# Patient Record
Sex: Female | Born: 1970 | Race: White | Hispanic: Yes | Marital: Single | Smoking: Never smoker
Health system: Southern US, Community
[De-identification: ages and names within clinical notes are randomized; demographics above are authoritative.]

## PROBLEM LIST (undated history)

## (undated) DIAGNOSIS — F419 Anxiety disorder, unspecified: Secondary | ICD-10-CM

## (undated) DIAGNOSIS — M1990 Unspecified osteoarthritis, unspecified site: Secondary | ICD-10-CM

## (undated) DIAGNOSIS — F32A Depression, unspecified: Secondary | ICD-10-CM

## (undated) DIAGNOSIS — F329 Major depressive disorder, single episode, unspecified: Secondary | ICD-10-CM

## (undated) DIAGNOSIS — I10 Essential (primary) hypertension: Secondary | ICD-10-CM

## (undated) HISTORY — DX: Major depressive disorder, single episode, unspecified: F32.9

## (undated) HISTORY — DX: Unspecified osteoarthritis, unspecified site: M19.90

## (undated) HISTORY — DX: Depression, unspecified: F32.A

## (undated) HISTORY — DX: Essential (primary) hypertension: I10

## (undated) HISTORY — DX: Anxiety disorder, unspecified: F41.9

---

## 1998-06-06 ENCOUNTER — Inpatient Hospital Stay (HOSPITAL_COMMUNITY): Admission: AD | Admit: 1998-06-06 | Discharge: 1998-06-06 | Payer: Self-pay | Admitting: *Deleted

## 1998-06-06 ENCOUNTER — Encounter: Payer: Self-pay | Admitting: *Deleted

## 1998-06-24 ENCOUNTER — Other Ambulatory Visit: Admission: RE | Admit: 1998-06-24 | Discharge: 1998-06-24 | Payer: Self-pay | Admitting: Obstetrics and Gynecology

## 1998-12-28 ENCOUNTER — Inpatient Hospital Stay (HOSPITAL_COMMUNITY): Admission: AD | Admit: 1998-12-28 | Discharge: 1999-01-01 | Payer: Self-pay | Admitting: Obstetrics

## 1999-06-04 ENCOUNTER — Encounter: Admission: RE | Admit: 1999-06-04 | Discharge: 1999-06-04 | Payer: Self-pay | Admitting: Internal Medicine

## 1999-08-11 ENCOUNTER — Encounter: Admission: RE | Admit: 1999-08-11 | Discharge: 1999-08-11 | Payer: Self-pay | Admitting: Internal Medicine

## 1999-12-24 ENCOUNTER — Encounter: Admission: RE | Admit: 1999-12-24 | Discharge: 1999-12-24 | Payer: Self-pay | Admitting: Internal Medicine

## 2000-02-07 ENCOUNTER — Emergency Department (HOSPITAL_COMMUNITY): Admission: EM | Admit: 2000-02-07 | Discharge: 2000-02-08 | Payer: Self-pay

## 2000-02-08 ENCOUNTER — Encounter: Payer: Self-pay | Admitting: Emergency Medicine

## 2000-07-14 ENCOUNTER — Encounter: Admission: RE | Admit: 2000-07-14 | Discharge: 2000-07-14 | Payer: Self-pay | Admitting: Internal Medicine

## 2000-07-19 ENCOUNTER — Encounter: Admission: RE | Admit: 2000-07-19 | Discharge: 2000-07-19 | Payer: Self-pay | Admitting: Internal Medicine

## 2000-08-02 ENCOUNTER — Encounter: Admission: RE | Admit: 2000-08-02 | Discharge: 2000-08-02 | Payer: Self-pay | Admitting: Hematology and Oncology

## 2001-08-13 ENCOUNTER — Encounter: Admission: RE | Admit: 2001-08-13 | Discharge: 2001-08-13 | Payer: Self-pay | Admitting: Family Medicine

## 2001-08-22 ENCOUNTER — Ambulatory Visit (HOSPITAL_COMMUNITY): Admission: RE | Admit: 2001-08-22 | Discharge: 2001-08-22 | Payer: Self-pay | Admitting: Family Medicine

## 2001-09-18 ENCOUNTER — Encounter: Admission: RE | Admit: 2001-09-18 | Discharge: 2001-09-18 | Payer: Self-pay | Admitting: Family Medicine

## 2001-09-25 ENCOUNTER — Encounter: Admission: RE | Admit: 2001-09-25 | Discharge: 2001-09-25 | Payer: Self-pay | Admitting: Family Medicine

## 2001-11-05 ENCOUNTER — Encounter: Admission: RE | Admit: 2001-11-05 | Discharge: 2001-11-05 | Payer: Self-pay | Admitting: Family Medicine

## 2001-11-21 ENCOUNTER — Encounter: Admission: RE | Admit: 2001-11-21 | Discharge: 2001-11-21 | Payer: Self-pay | Admitting: Family Medicine

## 2001-12-05 ENCOUNTER — Encounter: Admission: RE | Admit: 2001-12-05 | Discharge: 2001-12-05 | Payer: Self-pay | Admitting: Family Medicine

## 2001-12-12 ENCOUNTER — Encounter: Admission: RE | Admit: 2001-12-12 | Discharge: 2001-12-12 | Payer: Self-pay | Admitting: Family Medicine

## 2001-12-21 ENCOUNTER — Encounter: Admission: RE | Admit: 2001-12-21 | Discharge: 2001-12-21 | Payer: Self-pay | Admitting: Family Medicine

## 2001-12-27 ENCOUNTER — Inpatient Hospital Stay (HOSPITAL_COMMUNITY): Admission: AD | Admit: 2001-12-27 | Discharge: 2001-12-28 | Payer: Self-pay | Admitting: Obstetrics and Gynecology

## 2002-03-12 ENCOUNTER — Encounter: Admission: RE | Admit: 2002-03-12 | Discharge: 2002-03-12 | Payer: Self-pay | Admitting: Family Medicine

## 2006-01-01 ENCOUNTER — Encounter (INDEPENDENT_AMBULATORY_CARE_PROVIDER_SITE_OTHER): Payer: Self-pay | Admitting: *Deleted

## 2006-04-03 ENCOUNTER — Ambulatory Visit: Payer: Self-pay | Admitting: Family Medicine

## 2006-04-03 ENCOUNTER — Ambulatory Visit (HOSPITAL_COMMUNITY): Admission: RE | Admit: 2006-04-03 | Discharge: 2006-04-03 | Payer: Self-pay | Admitting: Family Medicine

## 2006-04-28 ENCOUNTER — Ambulatory Visit: Payer: Self-pay | Admitting: Family Medicine

## 2006-05-05 ENCOUNTER — Ambulatory Visit: Payer: Self-pay | Admitting: Family Medicine

## 2006-06-09 ENCOUNTER — Ambulatory Visit: Payer: Self-pay | Admitting: Family Medicine

## 2006-08-31 DIAGNOSIS — G43909 Migraine, unspecified, not intractable, without status migrainosus: Secondary | ICD-10-CM

## 2006-08-31 DIAGNOSIS — E669 Obesity, unspecified: Secondary | ICD-10-CM | POA: Insufficient documentation

## 2006-08-31 DIAGNOSIS — I10 Essential (primary) hypertension: Secondary | ICD-10-CM | POA: Insufficient documentation

## 2006-09-01 ENCOUNTER — Encounter (INDEPENDENT_AMBULATORY_CARE_PROVIDER_SITE_OTHER): Payer: Self-pay | Admitting: *Deleted

## 2007-06-23 ENCOUNTER — Inpatient Hospital Stay (HOSPITAL_COMMUNITY): Admission: AD | Admit: 2007-06-23 | Discharge: 2007-06-23 | Payer: Self-pay | Admitting: Gynecology

## 2007-08-30 ENCOUNTER — Ambulatory Visit (HOSPITAL_COMMUNITY): Admission: RE | Admit: 2007-08-30 | Discharge: 2007-08-30 | Payer: Self-pay | Admitting: Obstetrics and Gynecology

## 2007-09-13 ENCOUNTER — Ambulatory Visit (HOSPITAL_COMMUNITY): Admission: RE | Admit: 2007-09-13 | Discharge: 2007-09-13 | Payer: Self-pay | Admitting: Obstetrics and Gynecology

## 2008-01-20 ENCOUNTER — Ambulatory Visit: Payer: Self-pay | Admitting: Obstetrics and Gynecology

## 2008-01-20 ENCOUNTER — Inpatient Hospital Stay (HOSPITAL_COMMUNITY): Admission: AD | Admit: 2008-01-20 | Discharge: 2008-01-20 | Payer: Self-pay | Admitting: Obstetrics and Gynecology

## 2008-01-25 ENCOUNTER — Inpatient Hospital Stay (HOSPITAL_COMMUNITY): Admission: AD | Admit: 2008-01-25 | Discharge: 2008-01-26 | Payer: Self-pay | Admitting: Gynecology

## 2008-01-25 ENCOUNTER — Ambulatory Visit: Payer: Self-pay | Admitting: Family Medicine

## 2010-11-22 ENCOUNTER — Ambulatory Visit (INDEPENDENT_AMBULATORY_CARE_PROVIDER_SITE_OTHER): Payer: Self-pay | Admitting: Family Medicine

## 2010-11-22 ENCOUNTER — Encounter: Payer: Self-pay | Admitting: Family Medicine

## 2010-11-22 VITALS — BP 144/86 | HR 80 | Temp 97.7°F | Ht 60.0 in | Wt 172.5 lb

## 2010-11-22 DIAGNOSIS — R5383 Other fatigue: Secondary | ICD-10-CM

## 2010-11-22 DIAGNOSIS — I10 Essential (primary) hypertension: Secondary | ICD-10-CM

## 2010-11-22 DIAGNOSIS — E669 Obesity, unspecified: Secondary | ICD-10-CM

## 2010-11-22 LAB — BASIC METABOLIC PANEL
BUN: 11 mg/dL (ref 6–23)
Chloride: 104 mEq/L (ref 96–112)
Creat: 0.47 mg/dL (ref 0.40–1.20)
Glucose, Bld: 95 mg/dL (ref 70–99)

## 2010-11-22 LAB — CBC
HCT: 42.6 % (ref 36.0–46.0)
Hemoglobin: 14.3 g/dL (ref 12.0–15.0)
MCV: 91.2 fL (ref 78.0–100.0)
RDW: 13.1 % (ref 11.5–15.5)
WBC: 5.6 10*3/uL (ref 4.0–10.5)

## 2010-11-22 NOTE — Patient Instructions (Addendum)
Nice to meet you. Normal blood pressure is <140/90. Please make an appointment in next month for blood pressure. I will call you if your lab tests are abnormal.     Hipertensin (presin arterial elevada) (Hypertension, High Blood Pressure) Cuando el corazn late [employer] a travs de las arterias. La fuerza que se origina es la presin arterial. Si la presin es demasiado elevada, se denomina hipertensin. El peligro radica en que puede sufrirla y no saberlo. Hipertensin puede significar que su corazn debe trabajar ms intensamente para bombear sangre. Las arterias pueden Dietitian o rgidas. El [place] extra [place] el riesgo de enfermedades cardacas, ictus y [employer].  La presin arterial est formada por dos nmeros: el nmero mayor sobre el nmero menor, por ejemplo110/70. Se seala "110/72". Los valores ideales son por debajo de 120 para el nmero ms alto (sistlica) y por debajo de 80 para el ms bajo (diastlica). Anote su presin sangunea hoy. Debe prestar mucha atencin a su presin arterial si sufre alguna otra enfermedad como:  Insuficiencia cardaca  Ataques cardiacos previos   Diabetes   Enfermedad renal crnica   Ictus previo   Mltiples factores de riesgo para enfermedades cardacas   Para diagnosticar si usted sufre hipertensin arterial, debe medirse la presin mientras encuentra sentado con el brazo elevado a la altura del nivel del corazn. Debe medirse al menos 2 veces. Una nica lectura de presin arterial elevada (especialmente en el servicio de emergencias) no significa que necesita tratamiento. Hay enfermedades en las que la presin arterial es diferente en ambos brazos. Es importante que consulte rpidamente con su mdico para un control. La [employer] de las personas sufren hipertensin esencial, lo que significa que no tiene una causa especfica. Este tipo de hipertensin puede bajarse modificando algunos factores en el estilo de vida  como:  Librarian, academic.  El consumo de cigarrillos.   La falta de actividad fsica.   Peso excesivo  Consumo de drogas y alcohol.   Consumiendo menos sal   La mayora de las personas no tienen sntomas hasta que la hipertensin ocasiona un dao en el organismo. El tratamiento efectivo puede evitar, Designer, industrial/product o reducir ese dao. TRATAMIENTO: El tratamiento para la hipertensin, cuando se ha identificado una causa, est dirigido a la misma Hay un gran nmero en medicamentos para tratarla. Se agrupan en diferentes categoras y Media planner los medicamentos indicados para usted. Muchos medicamentos disponibles tienen efectos secundarios. Debe comentar los efectos secundarios con su mdico. Si la presin arterial permanece elevada despus de modificar su estilo de vida o comenzar a tomar medicamentos:  Los medicamentos deben ser reemplazados   Puede ser necesario evaluar otros problemas.   Debe estar seguro que comprende las indicaciones, que sabe cmo y cundo [employer].   Asegrese de Education officer, environmental un control con su mdico dentro del tiempo indicado (generalmente dentro de las [employer]) para volver a Systems analyst presin arterial y Dentist los medicamentos prescritos.   Si est tomando ms de un medicamento para la presin arterial, asegrese que sabe cmo y en qu momentos debe tomarlos. Tomar los medicamentos al mismo tiempo puede dar como resultado un gran descenso en la presin arterial.  SOLICITE ATENCIN MDICA DE INMEDIATO SI PRESENTA:  Dolor de cabeza intenso, visin borrosa o cambios en la visin, o confusin.   Debilidad o adormecimientos inusuales o sensacin de desmayo.   Dolor de pecho o abdominal intenso, vmitos o problemas para respirar.  ASEGRESE QUE:   Comprende estas instrucciones.  Controlar su enfermedad.   Solicitar ayuda inmediatamente si no mejora o si empeora.  Document Released: 06/20/2005 Document Re-Released: 12/08/2009 [facility] Patient  Information 2011 [place], [place].

## 2010-11-22 NOTE — Assessment & Plan Note (Signed)
Will screen for diabetes with A1c testing and TSH today. Discussed starting exercise on a daily basis and patient agrees to attempt this.

## 2010-11-22 NOTE — Assessment & Plan Note (Addendum)
Borderline pressure today. Discussed lifestyle modifications of diet and exercise. Encouraged patient to check BP with goal of <140/90. Will follow up in one month and consider medication if not responsive to conservative management. Screening labs today.

## 2010-11-22 NOTE — Progress Notes (Signed)
  Subjective:    Patient ID: Lisa [name], female    DOB: 11/18/1970, 40 y.o.   MRN: [id number]  HPI [facility] patient presents from [facility].  1. Fatigue. Present since March (3 months now). Has no energy despite 7 hours of sleep daily. Is working currently, but struggle with motivation. Unsure of reason. Has very light menstrual periods on a monthly basis since IUD was placed. No blood in stool. Has struggled with being overweight for some time. Endorses some depressed mood that started after the fatigue.  2. BP. Had been receiving routine GYN care at [facility] health department and has been told to see a doctor for high blood pressure. Has several family members with hypertension.   3. Health maintenance. Gets yearly pap testing at [facility]. Patient denies ever having abnormal. Mirena IUD since 2009.    Review of Systems Denies abnormal bleeding or bruising. Endorses skin changes including dark spots on face.     Objective:   Physical Exam  Vitals reviewed. Constitutional: She is oriented to person, place, and time. She appears well-developed and well-nourished. No distress.  HENT:  Head: Normocephalic and atraumatic.       Mild macular melasma in malar area, R>L.   Neck: Normal range of motion. Neck supple.  Cardiovascular: Normal rate, regular rhythm and normal heart sounds.  Exam reveals no gallop and no friction rub.   No murmur heard. Pulmonary/Chest: Effort normal and breath sounds normal. No respiratory distress. She has no wheezes. She has no rales.  Musculoskeletal: She exhibits no edema and no tenderness.  Neurological: She is alert and oriented to person, place, and time. No cranial nerve deficit. Coordination normal.  Skin: Skin is warm.          Assessment & Plan:

## 2010-11-22 NOTE — Assessment & Plan Note (Signed)
Vague complaint and uncertain etiology. Possible organic causes thyroid dysregulation, anemia, diabetes. Less likely depression. Will obtain screening labs and follow up in one month.

## 2010-11-23 ENCOUNTER — Encounter: Payer: Self-pay | Admitting: Family Medicine

## 2011-01-31 ENCOUNTER — Encounter: Payer: Self-pay | Admitting: Family Medicine

## 2011-01-31 ENCOUNTER — Ambulatory Visit (INDEPENDENT_AMBULATORY_CARE_PROVIDER_SITE_OTHER): Payer: Self-pay | Admitting: Family Medicine

## 2011-01-31 VITALS — BP 150/90 | HR 70 | Temp 98.2°F | Wt 173.6 lb

## 2011-01-31 DIAGNOSIS — R5383 Other fatigue: Secondary | ICD-10-CM

## 2011-01-31 DIAGNOSIS — I10 Essential (primary) hypertension: Secondary | ICD-10-CM

## 2011-01-31 DIAGNOSIS — B009 Herpesviral infection, unspecified: Secondary | ICD-10-CM

## 2011-01-31 DIAGNOSIS — F329 Major depressive disorder, single episode, unspecified: Secondary | ICD-10-CM

## 2011-01-31 DIAGNOSIS — R5381 Other malaise: Secondary | ICD-10-CM

## 2011-01-31 MED ORDER — VALACYCLOVIR HCL 500 MG PO TABS: 500.0000 mg | ORAL_TABLET | Freq: Every day | ORAL | Status: DC

## 2011-01-31 MED ORDER — HYDROCHLOROTHIAZIDE 25 MG PO TABS: 25.0000 mg | ORAL_TABLET | Freq: Every day | ORAL | Status: DC

## 2011-01-31 NOTE — Patient Instructions (Signed)
Nice to see you again. I will refill your medicines. Make appointment in one month for physical exam. Try to exercise daily to help your energy level.  Hipertensin (presin arterial elevada) (Hypertension, High Blood Pressure) Cuando el corazn late [employer] a travs de las arterias. La fuerza que se origina es la presin arterial. Si la presin es demasiado elevada, se denomina hipertensin. El peligro radica en que puede sufrirla y no saberlo. Hipertensin puede significar que su corazn debe trabajar ms intensamente para bombear sangre. Las arterias pueden Dietitian o rgidas. El [place] extra [place] el riesgo de enfermedades cardacas, ictus y [employer].  La presin arterial est formada por dos nmeros: el nmero mayor sobre el nmero menor, por ejemplo110/70. Se seala "110/72". Los valores ideales son por debajo de 120 para el nmero ms alto (sistlica) y por debajo de 80 para el ms bajo (diastlica). Anote su presin sangunea hoy. Debe prestar mucha atencin a su presin arterial si sufre alguna otra enfermedad como:  Insuficiencia cardaca  Ataques cardiacos previos   Diabetes   Enfermedad renal crnica   Ictus previo   Mltiples factores de riesgo para enfermedades cardacas   Para diagnosticar si usted sufre hipertensin arterial, debe medirse la presin mientras encuentra sentado con el brazo elevado a la altura del nivel del corazn. Debe medirse al menos 2 veces. Una nica lectura de presin arterial elevada (especialmente en el servicio de emergencias) no significa que necesita tratamiento. Hay enfermedades en las que la presin arterial es diferente en ambos brazos. Es importante que consulte rpidamente con su mdico para un control. La [employer] de las personas sufren hipertensin esencial, lo que significa que no tiene una causa especfica. Este tipo de hipertensin puede bajarse modificando algunos factores en el estilo de vida como:  Librarian, academic.  El  consumo de cigarrillos.   La falta de actividad fsica.   Peso excesivo  Consumo de drogas y alcohol.   Consumiendo menos sal   La mayora de las personas no tienen sntomas hasta que la hipertensin ocasiona un dao en el organismo. El tratamiento efectivo puede evitar, Designer, industrial/product o reducir ese dao. TRATAMIENTO: El tratamiento para la hipertensin, cuando se ha identificado una causa, est dirigido a la misma Hay un gran nmero en medicamentos para tratarla. Se agrupan en diferentes categoras y Media planner los medicamentos indicados para usted. Muchos medicamentos disponibles tienen efectos secundarios. Debe comentar los efectos secundarios con su mdico. Si la presin arterial permanece elevada despus de modificar su estilo de vida o comenzar a tomar medicamentos:  Los medicamentos deben ser reemplazados   Puede ser necesario evaluar otros problemas.   Debe estar seguro que comprende las indicaciones, que sabe cmo y cundo [employer].   Asegrese de Education officer, environmental un control con su mdico dentro del tiempo indicado (generalmente dentro de las [employer]) para volver a Systems analyst presin arterial y Dentist los medicamentos prescritos.   Si est tomando ms de un medicamento para la presin arterial, asegrese que sabe cmo y en qu momentos debe tomarlos. Tomar los medicamentos al mismo tiempo puede dar como resultado un gran descenso en la presin arterial.  SOLICITE ATENCIN MDICA DE INMEDIATO SI PRESENTA:  Dolor de cabeza intenso, visin borrosa o cambios en la visin, o confusin.   Debilidad o adormecimientos inusuales o sensacin de desmayo.   Dolor de pecho o abdominal intenso, vmitos o problemas para respirar.  ASEGRESE QUE:   Comprende estas instrucciones.   Controlar su enfermedad.  Solicitar ayuda inmediatamente si no mejora o si empeora.  Document Released: 06/20/2005 Document Re-Released: 12/08/2009 [facility] Patient Information 2011  [place], [place].

## 2011-02-01 ENCOUNTER — Encounter: Payer: Self-pay | Admitting: Family Medicine

## 2011-02-01 DIAGNOSIS — B009 Herpesviral infection, unspecified: Secondary | ICD-10-CM | POA: Insufficient documentation

## 2011-02-01 DIAGNOSIS — F329 Major depressive disorder, single episode, unspecified: Secondary | ICD-10-CM | POA: Insufficient documentation

## 2011-02-01 NOTE — Assessment & Plan Note (Signed)
Consistently above goal despite exercise and diet. Will start HCTZ 12.5 and titrate as needed. F/u in one month for CPE and will check BP at home. Labs checked recently show normal renal function. May consider low dose ACEI or BB if needed for goal <140/90.

## 2011-02-01 NOTE — Assessment & Plan Note (Signed)
TSH, CBC, BMET all normal 2 months ago. I suspect this may be related to depression symptoms. Encouraged regular exercise and discussed future utility of SSRI or therapy if patient desires. Will defer today.

## 2011-02-01 NOTE — Progress Notes (Signed)
  Subjective:    Patient ID: Lisa [name], female    DOB: 1970/07/20, 40 y.o.   MRN: [id number]  HPI  1. BP. Has attempted diet and exercise but BP remains in 140s consistently. Does not take any medications. Denies HA, CP, dyspnea, edema.  2. 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 No active lesions currently. Has been taking suppressive valtrex prescribed by health dept for months. Since she is transitioning care to this clinic, requests for [place] to assume this prescription.   3. Fatigue. For about one year feels tired. Not sure why. Sleep is adequate and uninterrupted normally. 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 Does admit to feeling sad regularly. Does not find pleasure in activities. No appetite changes or sleep disturbance.   Review of Systems Denies hematochezia, heavy menstrual periods, weight change, dizzyness.     Objective:   Physical Exam  Vitals reviewed. Constitutional: She is oriented to person, place, and time. She appears well-developed and well-nourished. No distress.  HENT:  Head: Normocephalic and atraumatic.  Eyes: EOM are normal. Pupils are equal, round, and reactive to light.  Cardiovascular: Normal rate, regular rhythm and normal heart sounds.   No murmur heard. Pulmonary/Chest: Effort normal and breath sounds normal. No respiratory distress. She has no wheezes. She has no rales.  Musculoskeletal: She exhibits no edema.  Neurological: She is alert and oriented to person, place, and time.  Psychiatric: Her behavior is normal.       Slight flat affect but does smile infrequently.           Assessment & Plan:

## 2011-02-01 NOTE — Assessment & Plan Note (Signed)
Encouraged patient to use family members for support and take time for herself on a daily basis. Language barrier seemed to create some difficulty. Will check PHQ-9 at next visit and may benefit from SSRI.

## 2011-02-01 NOTE — Assessment & Plan Note (Signed)
Has had 2 outbreaks in 6 months, none currently.  Will continue suppressive therapy 500mg  daily and BID x 3 days for outbreaks. Refilled this medication today. May require higher dose if frequency increases.

## 2011-03-03 ENCOUNTER — Encounter: Payer: Self-pay | Admitting: Family Medicine

## 2011-03-03 ENCOUNTER — Ambulatory Visit (INDEPENDENT_AMBULATORY_CARE_PROVIDER_SITE_OTHER): Payer: Self-pay | Admitting: Family Medicine

## 2011-03-03 ENCOUNTER — Other Ambulatory Visit (HOSPITAL_COMMUNITY)
Admission: RE | Admit: 2011-03-03 | Discharge: 2011-03-03 | Disposition: A | Discharge status: Home / Self Care | Payer: Self-pay | Source: Ambulatory Visit | Attending: Family Medicine | Admitting: Family Medicine

## 2011-03-03 VITALS — BP 131/80 | HR 67 | Temp 98.5°F | Ht 59.75 in | Wt 173.0 lb

## 2011-03-03 DIAGNOSIS — N760 Acute vaginitis: Secondary | ICD-10-CM

## 2011-03-03 DIAGNOSIS — R5381 Other malaise: Secondary | ICD-10-CM

## 2011-03-03 DIAGNOSIS — Z23 Encounter for immunization: Secondary | ICD-10-CM

## 2011-03-03 DIAGNOSIS — Z01419 Encounter for gynecological examination (general) (routine) without abnormal findings: Secondary | ICD-10-CM | POA: Insufficient documentation

## 2011-03-03 DIAGNOSIS — R5383 Other fatigue: Secondary | ICD-10-CM

## 2011-03-03 DIAGNOSIS — N898 Other specified noninflammatory disorders of vagina: Secondary | ICD-10-CM | POA: Insufficient documentation

## 2011-03-03 DIAGNOSIS — I10 Essential (primary) hypertension: Secondary | ICD-10-CM

## 2011-03-03 DIAGNOSIS — F329 Major depressive disorder, single episode, unspecified: Secondary | ICD-10-CM

## 2011-03-03 DIAGNOSIS — B009 Herpesviral infection, unspecified: Secondary | ICD-10-CM

## 2011-03-03 DIAGNOSIS — Z124 Encounter for screening for malignant neoplasm of cervix: Secondary | ICD-10-CM

## 2011-03-03 LAB — POCT WET PREP (WET MOUNT)
Clue Cells Wet Prep HPF POC: NEGATIVE
Trichomonas Wet Prep HPF POC: NEGATIVE

## 2011-03-03 NOTE — Progress Notes (Signed)
  Subjective:    Patient ID: Lisa [name], female    DOB: 12-16-1970, 40 y.o.   MRN: [id number]  HPI CPP.  1. HTN. Has been taking HCTZ daily. Does not check bp at home. Denies any medication side effects or presyncope.   2. Fatigue. Patient feels improvement in her generalized malaise and fatigue since initiation of HCTZ. Denies depression, sadness, guilt.  3. HM. Has mirena. Intermittent spotting other regular menstrual cycle. Does not perform breast exams, not felt any lumps.   Review of Systems Endorses mild symmetric LE swelling at end of the day and intemittent spotting, vaginal discharge. No weight change, vision, change, dysuria, polyuria, CP, dypsnea.     Objective:   Physical Exam  Vitals reviewed. Constitutional: She is oriented to person, place, and time. She appears well-developed and well-nourished. No distress.  HENT:  Head: Normocephalic and atraumatic.  Eyes: EOM are normal. Pupils are equal, round, and reactive to light.  Cardiovascular: Normal rate, regular rhythm, normal heart sounds and intact distal pulses.   No murmur heard. Pulmonary/Chest: Effort normal and breath sounds normal. No respiratory distress. She has no wheezes. She has no rales.  Abdominal: Soft.  Genitourinary: Uterus normal. Vaginal discharge found.       Moderate white discharge. Mirena stings visualized. No ulcers or lesions.  Breast exam> no masses or LAD or nipple discharge.  Musculoskeletal: Normal range of motion. She exhibits no edema and no tenderness.  Neurological: She is alert and oriented to person, place, and time. No cranial nerve deficit.  Psychiatric: She has a normal mood and affect.          Assessment & Plan:

## 2011-03-03 NOTE — Assessment & Plan Note (Signed)
Improved with HTN treatment.

## 2011-03-03 NOTE — Patient Instructions (Signed)
I will call if your lab tests are not normal. Will call in your medication to health department.  Continue taking hydrochlorothiazide. Your blood pressure is much better today!  Hipertensin (Presin arterial elevada) (Hypertension, High Blood Pressure) La mayor parte de las personas que sufren de presin arterial elevada (hipertensin) no tienen sntomas. Tener presin arterial elevada puede ser un problema peligroso. La hipertensin es peligrosa porque es posible tenerla y no saberlo. La hipertensin arterial puede significar que el msculo cardaco est trabajando ms de lo normal para bombear la sangre. Su presin arterial se mide con dos nmeros. El primero es para cuando su corazn est contrado, y el segundo nmero es para cuando el corazn est relajado. Cuanto mayores sean estos nmeros, es mayor el riesgo de sufrir problemas. Anote su presin sangunea hoy. Los valores ideales para los adultos son de 120/80 (mmHg) o menos. Es posible que su presin haya sido registrada al [employer] veces en el da de [place]. Es importante que le informe estos nmeros al mdico. Si no tiene un mdico, Air traffic controller en un hospital o la clnica comunitaria. Es posible que deba comenzar a Chemical engineer medicamentos para la presin arterial. Adems es posible que deba Education officer, environmental un cambio en los medicamentos que [place], segn le aconseje el mdico. Sin embargo, an una hipertensin leve aumenta a largo plazo los riesgos para la salud. Una nica lectura de presin elevada no significa que usted padezca hipertensin.  Su presin sangunea debe tomarse en un momento en el que est relajado. Tambin es importante que est sentado durante 10 minutos antes de Research scientist (physical sciences).   Algunas cosas que pueden aumentar su presin arterial son:   [place].   Enfermedades.   El estrs.   La cafena.   Algunos medicamentos (como descongestionantes).   La presin arterial elevada no necesariamente requiere un  tratamiento de emergencia.  CUIDADOS EN EL HOGAR  Es posible que sea necesario realizar cambios en su estilo de vida y en los medicamentos que toma, y esto incluye:   Publishing copy de [place].   Ejercitarse.   Limite el consumo de sal.   Deje de fumar.   Si toma descongestionantes o pastillas anticonceptivas, hable con el mdico. Estos medicamentos pueden hacer que la presin sangunea aumente.   No consuma drogas ilegales.   Las mujeres no deben beber ms de una bebida alcohlica por Futures trader. Los hombres no deben beber ms de dos bebidas alcohlicas por Futures trader.   Tome los medicamentos para la presin arterial. Deber tomar este medicamento todos [place]. Existen riesgos si no recibe tratamiento, [employer] se incluyen:   Enfermedades cardacas.   Ictus.   Insuficiencia renal.   Vea a su mdico segn le hayan indicado.  SOLICITE AYUDA DE INMEDIATO SI:  Sufre una cefalea grave.   Tiene visin borrosa o cambiante.   Se siente confundido.   Se siente dbil, adormecido, o desfalleciente.   Siente dolor en el pecho o en el abdomen.   Tiene vmitos.   No puede respirar bien.  Si usted tiene una presin de 180 o mayor, debe ver al mdico de inmediato. Esto vale especialmente en el caso de que tenga cualquiera de los sntomas mencionados anteriormente. ASEGRESE QUE:  Comprende estas instrucciones.   Controlar su enfermedad.   Solicitar ayuda de inmediato si no mejora o empeora.  Document Released: 12/08/2009  [facility] Patient Information 2011 [place], [place].

## 2011-03-03 NOTE — Assessment & Plan Note (Signed)
Will check wet-prep and routine GC/CHl today.

## 2011-03-03 NOTE — Assessment & Plan Note (Signed)
PHQ-9 result is 2 today. Will follow up prn.

## 2011-03-03 NOTE — Assessment & Plan Note (Signed)
At goal with initiation of HCTZ. Will continue current dosage. Follow up in one year.

## 2011-03-03 NOTE — Assessment & Plan Note (Signed)
Patient requests rx for prophylactic valtrex be sent to [facility] since she cannot afford this at [facility]. No active lesions today.

## 2011-03-04 LAB — GC/CHLAMYDIA PROBE AMP, GENITAL: Chlamydia, DNA Probe: NEGATIVE

## 2011-04-01 LAB — URINALYSIS, ROUTINE W REFLEX MICROSCOPIC
Glucose, UA: NEGATIVE
Hgb urine dipstick: NEGATIVE
Ketones, ur: 40 — AB
Specific Gravity, Urine: 1.025
pH: 6

## 2011-04-01 LAB — CBC
HCT: 39.8
HCT: 41.1
Hemoglobin: 13.6
MCHC: 33.8
MCHC: 34.2
MCV: 92.2
MCV: 92.4
Platelets: 271
RBC: 4.32
RDW: 14.9

## 2011-04-01 LAB — WET PREP, GENITAL: Yeast Wet Prep HPF POC: NONE SEEN

## 2011-04-01 LAB — RPR: RPR Ser Ql: NONREACTIVE

## 2011-04-08 LAB — WET PREP, GENITAL: Yeast Wet Prep HPF POC: NONE SEEN

## 2011-04-08 LAB — URINE MICROSCOPIC-ADD ON

## 2011-04-08 LAB — URINALYSIS, ROUTINE W REFLEX MICROSCOPIC
Glucose, UA: NEGATIVE
Ketones, ur: NEGATIVE
Protein, ur: NEGATIVE
pH: 6.5

## 2011-04-08 LAB — GC/CHLAMYDIA PROBE AMP, GENITAL: GC Probe Amp, Genital: NEGATIVE

## 2014-01-22 ENCOUNTER — Ambulatory Visit: Payer: Self-pay

## 2014-01-24 ENCOUNTER — Ambulatory Visit: Payer: No Typology Code available for payment source

## 2014-02-20 ENCOUNTER — Ambulatory Visit (INDEPENDENT_AMBULATORY_CARE_PROVIDER_SITE_OTHER): Payer: No Typology Code available for payment source | Admitting: Family Medicine

## 2014-02-20 DIAGNOSIS — T148XXA Other injury of unspecified body region, initial encounter: Secondary | ICD-10-CM

## 2014-02-20 MED ORDER — IBUPROFEN 600 MG PO TABS: 600.0000 mg | ORAL_TABLET | Freq: Three times a day (TID) | ORAL | Status: DC | PRN

## 2014-02-20 MED ORDER — CYCLOBENZAPRINE HCL 5 MG PO TABS: 5.0000 mg | ORAL_TABLET | Freq: Three times a day (TID) | ORAL | Status: DC | PRN

## 2014-02-20 NOTE — Patient Instructions (Addendum)
[name], it was a pleasure seeing you today. Today we talked about your back pain.   Muscle strain: I will give you antiinflammatory medication and a muscle relaxant.  You can follow-up with your PCP.  If you have any questions or concerns, please do not hesitate to call the office at [phone].  Sincerely,  Jacquelin Hawkingalph Argie Applegate, MD   Muscle Strain A muscle strain is an injury that occurs when a muscle is stretched beyond its normal length. Usually a small number of muscle fibers are torn when this happens. Muscle strain is rated in degrees. First-degree strains have the least amount of muscle fiber tearing and pain. Second-degree and third-degree strains have increasingly more tearing and pain.  Usually, recovery from muscle strain takes 1-2 weeks. Complete healing takes 5-6 weeks.  CAUSES  Muscle strain happens when a sudden, violent force placed on a muscle stretches it too far. This may occur with lifting, sports, or a fall.  RISK FACTORS Muscle strain is especially common in athletes.  SIGNS AND SYMPTOMS At the site of the muscle strain, there may be:  Pain.  Bruising.  Swelling.  Difficulty using the muscle due to pain or lack of normal function. DIAGNOSIS  Your health care provider will perform a physical exam and ask about your medical history. TREATMENT  Often, the best treatment for a muscle strain is resting, icing, and applying cold compresses to the injured area.  HOME CARE INSTRUCTIONS   Use the PRICE method of treatment to promote muscle healing during the first 2-3 days after your injury. The PRICE method involves:  Protecting the muscle from being injured again.  Restricting your activity and resting the injured body part.  Icing your injury. To do this, put ice in a plastic bag. Place a towel between your skin and the bag. Then, apply the ice and leave it on from 15-20 minutes each hour. After the third day, switch to moist heat packs.  Apply  compression to the injured area with a splint or elastic bandage. Be careful not to wrap it too tightly. This may interfere with blood circulation or increase swelling.  Elevate the injured body part above the level of your heart as often as you can.  Only take over-the-counter or prescription medicines for pain, discomfort, or fever as directed by your health care provider.  Warming up prior to exercise helps to prevent future muscle strains. SEEK MEDICAL CARE IF:   You have increasing pain or swelling in the injured area.  You have numbness, tingling, or a significant loss of strength in the injured area. MAKE SURE YOU:   Understand these instructions.  Will watch your condition.  Will get help right away if you are not doing well or get worse. Document Released: 06/20/2005 Document Revised: 04/10/2013 Document Reviewed: 01/17/2013 [facility] Patient Information 2015 [place], [place]. This information is not intended to replace advice given to you by your health care provider. Make sure you discuss any questions you have with your health care provider.

## 2014-02-20 NOTE — Assessment & Plan Note (Signed)
Musculoskeletal pain. Ibuprofen for inflammation and muscle relaxant to help with pain.

## 2014-02-20 NOTE — Progress Notes (Signed)
   Subjective:    Patient ID: Lisa MarkerJuana [name], female    DOB: 1971-03-06, 43 y.o.   MRN: [id number]  Flank Pain This is a new problem. Episode onset: 3 weeks ago. The problem occurs constantly. The pain is present in the lumbar spine (right sided). The quality of the pain is described as aching. The pain does not radiate. The pain is the same all the time. The symptoms are aggravated by sitting and position. Pertinent negatives include no dysuria or fever. She has tried nothing for the symptoms.  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   History  Substance Use Topics  . Smoking status: Never Smoker   . Smokeless tobacco: Not on file  . Alcohol Use: No    Review of Systems  Constitutional: Negative for fever.  Gastrointestinal: Negative for nausea and vomiting.  Genitourinary: Positive for flank pain. Negative for dysuria.      Objective:   Physical Exam  Musculoskeletal:       Lumbar back: She exhibits tenderness (right sided lumbar tenderness). She exhibits normal range of motion.       Assessment & Plan:

## 2014-02-28 ENCOUNTER — Encounter: Payer: Self-pay | Admitting: Family Medicine

## 2014-02-28 ENCOUNTER — Ambulatory Visit (INDEPENDENT_AMBULATORY_CARE_PROVIDER_SITE_OTHER): Payer: No Typology Code available for payment source | Admitting: Family Medicine

## 2014-02-28 VITALS — BP 126/84 | HR 59 | Temp 98.0°F | Ht 59.0 in | Wt 178.0 lb

## 2014-02-28 DIAGNOSIS — M545 Low back pain, unspecified: Secondary | ICD-10-CM

## 2014-02-28 DIAGNOSIS — E669 Obesity, unspecified: Secondary | ICD-10-CM

## 2014-02-28 DIAGNOSIS — I10 Essential (primary) hypertension: Secondary | ICD-10-CM

## 2014-02-28 DIAGNOSIS — M549 Dorsalgia, unspecified: Secondary | ICD-10-CM | POA: Insufficient documentation

## 2014-02-28 DIAGNOSIS — R42 Dizziness and giddiness: Secondary | ICD-10-CM

## 2014-02-28 LAB — CBC WITH DIFFERENTIAL/PLATELET
BASOS PCT: 0 % (ref 0–1)
Basophils Absolute: 0 10*3/uL (ref 0.0–0.1)
Eosinophils Absolute: 0.1 10*3/uL (ref 0.0–0.7)
Eosinophils Relative: 2 % (ref 0–5)
HEMATOCRIT: 42.1 % (ref 36.0–46.0)
HEMOGLOBIN: 14.6 g/dL (ref 12.0–15.0)
Lymphocytes Relative: 41 % (ref 12–46)
Lymphs Abs: 2.7 10*3/uL (ref 0.7–4.0)
MCH: 31.3 pg (ref 26.0–34.0)
MCHC: 34.7 g/dL (ref 30.0–36.0)
MCV: 90.1 fL (ref 78.0–100.0)
MONO ABS: 0.6 10*3/uL (ref 0.1–1.0)
MONOS PCT: 9 % (ref 3–12)
Neutro Abs: 3.2 10*3/uL (ref 1.7–7.7)
Neutrophils Relative %: 48 % (ref 43–77)
Platelets: 294 10*3/uL (ref 150–400)
RBC: 4.67 MIL/uL (ref 3.87–5.11)
RDW: 13.8 % (ref 11.5–15.5)
WBC: 6.7 10*3/uL (ref 4.0–10.5)

## 2014-02-28 NOTE — Assessment & Plan Note (Signed)
R/O anemia. CBC checked today.

## 2014-02-28 NOTE — Progress Notes (Signed)
Subjective:     Patient ID: Lisa [name], female   DOB: 04-02-1971, 43 y.o.   MRN: [id number]  HPI HTN: Patient was seen at the health department few days ago and was told her BP was elevated, she does not check her BP regularly at home. She had been on HCTZ in the past for HTN, but she self discontinued it in 2012. Here for follow up. Obesity: She has been trying to lose weight. 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 No major change in her diet. Back pain: C/O back pain for which she is relieved by using flexeril. Pain is ok today. Dizziness:C/O recurrent dizziness on and off for the last few weeks, feels like she will fall, denies LOC or fainting, no blood loss, denies heavy menstrual flow. Denies GI symptoms. Appetite is normal.  Current Outpatient Prescriptions on File Prior to Visit  Medication Sig Dispense Refill  . cyclobenzaprine (FLEXERIL) 5 MG tablet Take 1 tablet (5 mg total) by mouth 3 (three) times daily as needed for muscle spasms.  30 tablet  0  . ibuprofen (ADVIL,MOTRIN) 600 MG tablet Take 1 tablet (600 mg total) by mouth every 8 (eight) hours as needed.  30 tablet  0  . hydrochlorothiazide 25 MG tablet Take 1 tablet (25 mg total) by mouth daily.  30 tablet  4  . valACYclovir (VALTREX) 500 MG tablet Take 1 tablet (500 mg total) by mouth daily.  90 tablet  3   No current facility-administered medications on file prior to visit.   Past Medical History  Diagnosis Date  . Hypertension   . Migraine       Review of Systems  Respiratory: Negative.   Cardiovascular: Negative.   Gastrointestinal: Negative.   Musculoskeletal: Positive for back pain.  Neurological: Positive for dizziness.  All other systems reviewed and are negative.  Filed Vitals:   02/28/14 0832  BP: 126/84  Pulse: 59  Temp: 98 F (36.7 C)  TempSrc: Oral  Weight: 178 lb (80.74 kg)       Objective:   Physical Exam  Nursing note and  vitals reviewed. Constitutional: She is oriented to person, place, and time. She appears well-developed. No distress.  Cardiovascular: Normal rate, regular rhythm, normal heart sounds and intact distal pulses.   No murmur heard. Pulmonary/Chest: Effort normal and breath sounds normal. No respiratory distress. She has no wheezes.  Abdominal: Soft. Bowel sounds are normal. She exhibits no distension and no mass. There is no tenderness.  Musculoskeletal: Normal range of motion.       Lumbar back: She exhibits normal range of motion.  Neurological: She is alert and oriented to person, place, and time.       Assessment:     HTN: Obesity: Back pain: Dizziness    Plan:     Check problem list.

## 2014-02-28 NOTE — Assessment & Plan Note (Addendum)
Diet and exercise counseling done for weight loss. Weight loss exercise instruction handout given written in spanish. BMP and lipid profile checked today. F/U in 4 wks for reassessment.

## 2014-02-28 NOTE — Patient Instructions (Signed)
Ejercicios para perder peso (Exercise to Lose Weight) La actividad fsica y una dieta saludable ayudan a perder peso. El mdico podr sugerirle ejercicios especficos. IDEAS Y CONSEJOS PARA HACER EJERCICIOS  Elija opciones econmicas que disfrute hacer , como caminar, andar en bicicleta o los vdeos para ejercitarse.  Utilice las escaleras en lugar del ascensor.  Camine durante la hora del almuerzo.  Estacione el auto lejos del lugar de trabajo o estudio.  Concurra a un gimnasio o tome clases de gimnasia.  Comience con 5  10 minutos de actividad fsica por da. Ejercite hasta 30 minutos, 4 a 6 das por semana.  Utilice zapatos que tengan un buen soporte y ropas cmodas.  Elongue antes y despus de ejercitar.  Ejercite hasta que aumente la respiracin y el corazn palpite rpido.  Beba agua extra cuando ejercite.  No haga ejercicio hasta lastimarse, sentirse mareado o que le falte mucho el aire. La actividad fsica puede quemar alrededor de 150 caloras.  Correr 20 cuadras en 15 minutos.  Jugar vley durante 45 a 60 minutos.  Limpiar y encerar el auto durante 45 a 60 minutos.  Jugar ftbol americano de toque.  Caminar 25 cuadras en 35 minutos.  Empujar un cochecito 20 cuadras en 30 minutos.  Jugar baloncesto durante 30 minutos.  Rastrillar hojas secas durante 30 minutos.  Andar en bicicleta 80 cuadras en 30 minutos.  Caminar 30 cuadras en 30 minutos.  Bailar durante 30 minutos.  Quitar la nieve con una pala durante 15 minutos.  Nadar vigorosamente durante 20 minutos.  Subir escaleras durante 15 minutos.  Andar en bicicleta 60 cuadras durante 15 minutos.  Arreglar el jardn entre 30 y 45 minutos.  Saltar a la soga durante 15 minutos.  Limpiar vidrios o pisos durante 45 a 60 minutos. Document Released: 09/24/2010 Document Revised: 09/12/2011 ExitCare Patient Information 2015 [employer]. This information is not intended to replace advice given to you  by your health care provider. Make sure you discuss any questions you have with your health care provider.  

## 2014-02-28 NOTE — Assessment & Plan Note (Addendum)
BP looks normal today. Vital flow sheet reviewed, she did have elevated BP in 2012. Diet and exercise counseling done. Avoid to much salt intake. No antihypertensive regimen recommended at this time. BMP and lipid profile checked. F/U with PCP in 4 wks for reassessment.

## 2014-02-28 NOTE — Assessment & Plan Note (Signed)
Stable on Flexeril prn. Likely due to overuse due to nature of work. Continue Flexeril prn and Ibuprofen as needed.

## 2014-03-01 LAB — BASIC METABOLIC PANEL
BUN: 11 mg/dL (ref 6–23)
CALCIUM: 9.4 mg/dL (ref 8.4–10.5)
CHLORIDE: 102 meq/L (ref 96–112)
CO2: 28 mEq/L (ref 19–32)
CREATININE: 0.5 mg/dL (ref 0.50–1.10)
Glucose, Bld: 97 mg/dL (ref 70–99)
Potassium: 5.2 mEq/L (ref 3.5–5.3)
Sodium: 137 mEq/L (ref 135–145)

## 2014-03-01 LAB — LIPID PANEL
Cholesterol: 189 mg/dL (ref 0–200)
HDL: 47 mg/dL (ref >39)
LDL CALC: 116 mg/dL — AB (ref 0–99)
TRIGLYCERIDES: 129 mg/dL (ref <150)
Total CHOL/HDL Ratio: 4 Ratio
VLDL: 26 mg/dL (ref 0–40)

## 2014-03-03 ENCOUNTER — Encounter: Payer: Self-pay | Admitting: Family Medicine

## 2014-03-13 ENCOUNTER — Other Ambulatory Visit (HOSPITAL_COMMUNITY)
Admission: RE | Admit: 2014-03-13 | Discharge: 2014-03-13 | Disposition: A | Discharge status: Home / Self Care | Payer: No Typology Code available for payment source | Source: Ambulatory Visit | Attending: Family Medicine | Admitting: Family Medicine

## 2014-03-13 ENCOUNTER — Encounter: Payer: Self-pay | Admitting: Family Medicine

## 2014-03-13 ENCOUNTER — Ambulatory Visit (INDEPENDENT_AMBULATORY_CARE_PROVIDER_SITE_OTHER): Payer: No Typology Code available for payment source | Admitting: Family Medicine

## 2014-03-13 VITALS — BP 126/63 | HR 65 | Temp 97.9°F | Wt 176.0 lb

## 2014-03-13 DIAGNOSIS — Z1151 Encounter for screening for human papillomavirus (HPV): Secondary | ICD-10-CM | POA: Insufficient documentation

## 2014-03-13 DIAGNOSIS — Z124 Encounter for screening for malignant neoplasm of cervix: Secondary | ICD-10-CM

## 2014-03-13 DIAGNOSIS — N920 Excessive and frequent menstruation with regular cycle: Secondary | ICD-10-CM

## 2014-03-13 DIAGNOSIS — N921 Excessive and frequent menstruation with irregular cycle: Secondary | ICD-10-CM

## 2014-03-13 DIAGNOSIS — Z01419 Encounter for gynecological examination (general) (routine) without abnormal findings: Secondary | ICD-10-CM | POA: Insufficient documentation

## 2014-03-13 LAB — POCT URINE PREGNANCY: Preg Test, Ur: NEGATIVE

## 2014-03-13 MED ORDER — MEDROXYPROGESTERONE ACETATE 10 MG PO TABS: 10.0000 mg | ORAL_TABLET | Freq: Every day | ORAL | Status: DC

## 2014-03-13 NOTE — Assessment & Plan Note (Signed)
3 month history of prolonged bleeding with regular cycle (2 weeks bleeding, 2 weeks normal). CBC checked 1 week ago with normal hemoglobin around 14, and given patient is unable to quantify amount of blood would lean toward this being not "heavy and prolonged". Discussed case with Dr. Jennette Kettle Plan: transvaginal ultrasound, 10 days  provera, f/u for endometrial biopsy.

## 2014-03-13 NOTE — Patient Instructions (Signed)
We will need to do some extra testing for your bleeding, including an ultrasound image and an endometrial biopsy.   To control the bleeding, take provera  for 10 days. You may get some withdrawal bleeding a few days after stopping this.  Please schedule with either the [facility] health clinic or Dr. Waynetta Sandy for an endometrial biopsy. This will be done at the clinic here.  The ultrasound will be done at [facility] hospital.

## 2014-03-13 NOTE — Progress Notes (Signed)
Patient ID: Lisa [name], female   DOB: April 15, 1971, 43 y.o.   MRN: [id number]   Subjective:    Patient ID: Lisa [name], female    DOB: Mar 10, 1971, 45 y.o.   MRN: [id number]  HPI  Friend utilized as interpreter, offered official interpreter or phone but patient declined.  CC: bleeding for 2 weeks  # Prolonged bleeding:  States she had IUD removed about 1 year ago, normal regular periods up until 3 months ago  Last 3 periods have come once a month, but bleeding lasts for 2 weeks. Heaviest bleeding with brown clots during first 3 days, and then red blood for rest  Unable to state how much blood she is losing  States she had regular periods before the IUD was placed  She has lower abdominal cramping with the period  Has had 4 vaginal deliveries in the past  She is a non-smoker ROS: +fatigue, +dizziness  Review of Systems   See HPI for ROS. All other systems reviewed and are negative.  Past medical history, surgical, family, and social history reviewed and updated in the EMR. No new updates were made today. Objective:  BP 126/63  Pulse 65  Temp(Src) 97.9 F (36.6 C) (Oral)  Wt 176 lb (79.833 kg)  LMP 02/27/2014 Vitals reviewed  General: NAD Abdomen: obese, soft, mildly tender lower abdomen, normal bowel sounds GU: normal external genitalia, NAVM, brown clots present in vaginal vault, normal appearing cervix with some friability/bleeding after pap collected. Normal sized ovaries but tender in both adnexa, normal uterus.  Assessment & Plan:  See Problem List Documentation

## 2014-03-17 LAB — CYTOLOGY - PAP

## 2014-03-19 ENCOUNTER — Encounter: Payer: Self-pay | Admitting: Family Medicine

## 2014-03-21 ENCOUNTER — Ambulatory Visit (HOSPITAL_COMMUNITY)
Admission: RE | Admit: 2014-03-21 | Discharge: 2014-03-21 | Disposition: A | Discharge status: Home / Self Care | Payer: No Typology Code available for payment source | Source: Ambulatory Visit | Attending: Family Medicine | Admitting: Family Medicine

## 2014-03-21 ENCOUNTER — Encounter: Payer: Self-pay | Admitting: Family Medicine

## 2014-03-21 ENCOUNTER — Other Ambulatory Visit: Payer: Self-pay | Admitting: Family Medicine

## 2014-03-21 DIAGNOSIS — N921 Excessive and frequent menstruation with irregular cycle: Secondary | ICD-10-CM

## 2014-03-21 DIAGNOSIS — N925 Other specified irregular menstruation: Secondary | ICD-10-CM | POA: Insufficient documentation

## 2014-03-21 DIAGNOSIS — N938 Other specified abnormal uterine and vaginal bleeding: Secondary | ICD-10-CM | POA: Insufficient documentation

## 2014-03-21 DIAGNOSIS — N83209 Unspecified ovarian cyst, unspecified side: Secondary | ICD-10-CM | POA: Insufficient documentation

## 2014-03-21 DIAGNOSIS — N949 Unspecified condition associated with female genital organs and menstrual cycle: Secondary | ICD-10-CM | POA: Insufficient documentation

## 2014-03-24 ENCOUNTER — Encounter: Payer: Self-pay | Admitting: Family Medicine

## 2014-03-24 ENCOUNTER — Ambulatory Visit (INDEPENDENT_AMBULATORY_CARE_PROVIDER_SITE_OTHER): Payer: No Typology Code available for payment source | Admitting: Family Medicine

## 2014-03-24 VITALS — BP 110/60 | HR 60 | Temp 98.5°F | Ht 59.0 in | Wt 174.0 lb

## 2014-03-24 DIAGNOSIS — N920 Excessive and frequent menstruation with regular cycle: Secondary | ICD-10-CM

## 2014-03-24 DIAGNOSIS — Z23 Encounter for immunization: Secondary | ICD-10-CM

## 2014-03-24 NOTE — Assessment & Plan Note (Signed)
Bleeding stopped, finished provera course. TVUS results reviewed with patient and were normal. Endometrial biopsy done today, will f/u results with patient and if further workup indicated will call patient.

## 2014-03-24 NOTE — Patient Instructions (Signed)
You will have some cramping and bleeding for the next 1-2 days. You can take OTC ibuprofen, aleve for any pain.

## 2014-03-24 NOTE — Progress Notes (Signed)
Patient ID: Lisa [name], female   DOB: Jul 28, 1970, 43 y.o.   MRN: [id number]   Subjective:    Patient ID: Lisa [name], female    DOB: [birth year], 43 y.o.   MRN: [id number]  HPI  CC: endometrial biopsy  # Prolonged uterine bleeding:  Hx prolonged bleeding for last 3 periods (see clinic visit 1 week ago)  Bleeding stopped after last visit after she took provera (which she has finished yesterday) ROS: no fatigue, no lightheadedness, mild abdominal cramping  Review of Systems   See HPI for ROS. All other systems reviewed and are negative.  Past medical history, surgical, family, and social history reviewed and updated in the EMR. No new updates were made today. Objective:  BP 110/60  Pulse 60  Temp(Src) 98.5 F (36.9 C) (Oral)  Ht  (1.499 m)  Wt 174 lb (78.926 kg)  BMI 35.13 kg/m2  LMP 02/27/2014 Vitals reviewed  General: NAD GU: Normal external genitalia, moderate white mucous present in vault, no blood. Cervix visualized, no draining blood. Endometrial biopsy performed.  PROCEDURE NOTE: Endometrial Biopsy Patient given informed consent, signed copy in the chart.  Appropriate time out taken. . The patient was placed in the lithotomy position and the cervix brought into view with sterile speculum.  The portio of cervix was cleansed x 3 with betadine swabs.   A uterine sound was used to measure the uterus. A pipelle was introduced  into the uterus, suction created,  and an endometrial sample was obtained. All equipment was removed and accounted for. The patient tolerated the procedure well.  Minimal spotting type bleeding.  Patient given post procedure instructions. I will notify her of any pathology results.   Assessment & Plan:  See Problem List Documentation

## 2014-03-25 ENCOUNTER — Encounter: Payer: Self-pay | Admitting: Family Medicine

## 2014-03-25 ENCOUNTER — Ambulatory Visit: Payer: No Typology Code available for payment source | Admitting: Family Medicine

## 2014-09-23 ENCOUNTER — Ambulatory Visit: Payer: Self-pay

## 2015-05-27 ENCOUNTER — Encounter (HOSPITAL_COMMUNITY): Payer: Self-pay | Admitting: Emergency Medicine

## 2015-05-27 ENCOUNTER — Emergency Department (HOSPITAL_COMMUNITY)
Admission: EM | Admit: 2015-05-27 | Discharge: 2015-05-27 | Disposition: A | Discharge status: Home / Self Care | Payer: Self-pay | Attending: Emergency Medicine | Admitting: Emergency Medicine

## 2015-05-27 DIAGNOSIS — L02211 Cutaneous abscess of abdominal wall: Secondary | ICD-10-CM | POA: Insufficient documentation

## 2015-05-27 DIAGNOSIS — I10 Essential (primary) hypertension: Secondary | ICD-10-CM | POA: Insufficient documentation

## 2015-05-27 DIAGNOSIS — Z79899 Other long term (current) drug therapy: Secondary | ICD-10-CM | POA: Insufficient documentation

## 2015-05-27 DIAGNOSIS — L0291 Cutaneous abscess, unspecified: Secondary | ICD-10-CM

## 2015-05-27 DIAGNOSIS — G43909 Migraine, unspecified, not intractable, without status migrainosus: Secondary | ICD-10-CM | POA: Insufficient documentation

## 2015-05-27 MED ORDER — LIDOCAINE-EPINEPHRINE (PF) 2 %-1:200000 IJ SOLN
10.0000 mL | Freq: Once | INTRAMUSCULAR | Status: DC
  Filled 2015-05-27: qty 20

## 2015-05-27 NOTE — ED Notes (Signed)
Pt from home with son for eval of abscess noted RUQ of abdomen, area tender to touch. No drainage noted at this time. Denies any fevers at home. nad noted.

## 2015-05-27 NOTE — ED Provider Notes (Signed)
CSN: [id number]     Arrival date & time 05/27/15  1506 History  By signing my name below, I, Lyndel SafeKaitlyn Shelton, attest that this documentation has been prepared under the direction and in the presence of Mohmed Farver, PA-C. Electronically Signed: Lyndel SafeKaitlyn Shelton, ED Scribe. 05/27/2015. Marland Kitchen.   Chief Complaint  Patient presents with  . Abscess   The history is provided by the patient and a relative (The history is provided by the pt and translated by her son who is present in the room.). No language interpreter was used.   HPI Comments: Lisa [name] is a 44 y.o. female, with a PMHx of HTN who presents to the Emergency Department complaining of a gradually worsening area of localized swelling and erythema to right upper abdomen onset 5 days ago. Pt associates constant, moderate pain to the area and chills but denies fevers, nausea, vomiting or myalgias. Also denies a h/o abscesses. She has no other complaints today.  Past Medical History  Diagnosis Date  . Hypertension   . Migraine    History reviewed. No pertinent past surgical history. Family History  Problem Relation Age of Onset  . Diabetes Mother   . Hypertension Mother    Social History  Substance Use Topics  . Smoking status: Never Smoker   . Smokeless tobacco: None  . Alcohol Use: No   OB History    No data available     Review of Systems  Constitutional: Positive for chills. Negative for fever.  Gastrointestinal: Negative for nausea and vomiting.  Skin: Positive for wound ( abcess to RUQ).  All other systems reviewed and are negative.  Allergies  Review of patient's allergies indicates no known allergies.  Home Medications   Prior to Admission medications   Medication Sig Start Date End Date Taking? Authorizing Provider  cyclobenzaprine (FLEXERIL) 5 MG tablet Take 1 tablet (5 mg total) by mouth 3 (three) times daily as needed for muscle spasms. 02/20/14   Narda Bondsalph A Nettey, MD  hydrochlorothiazide 25 MG tablet Take 1  tablet (25 mg total) by mouth daily. 01/31/11 01/31/12  Durwin RegesJill N Konkol, MD  ibuprofen (ADVIL,MOTRIN) 600 MG tablet Take 1 tablet (600 mg total) by mouth every 8 (eight) hours as needed. 02/20/14   Narda Bondsalph A Nettey, MD  valACYclovir (VALTREX) 500 MG tablet Take 1 tablet (500 mg total) by mouth daily. 01/31/11 01/31/12  Durwin RegesJill N Konkol, MD   BP 164/83 mmHg  Pulse 94  Temp(Src) 99.4 F (37.4 C) (Oral)  Resp 16  Wt 174 lb (78.926 kg)  SpO2 100% Physical Exam  Constitutional: She appears well-developed and well-nourished. No distress.  Nontoxic appearing  HENT:  Head: Normocephalic and atraumatic.  Right Ear: External ear normal.  Left Ear: External ear normal.  Eyes: Conjunctivae are normal. Right eye exhibits no discharge. Left eye exhibits no discharge. No scleral icterus.  Neck: Normal range of motion.  Cardiovascular: Normal rate.   Pulmonary/Chest: Effort normal.  Musculoskeletal: Normal range of motion.  Moves all extremities spontaneously  Neurological: She is alert. Coordination normal.  Skin: Skin is warm and dry.     Small 1 cm area of fluctuance over the right upper quadrant of the abdomen that is mildly tender to palpation. Surrounding induration. Overlying erythema that extends approximately 2-3 cm around the abscess. No spontaneous drainage. No streaking of the skin. The abscess is not warm to touch.  Psychiatric: She has a normal mood and affect. Her behavior is normal.  Nursing note and vitals reviewed.  ED Course  Procedures  DIAGNOSTIC STUDIES: Oxygen Saturation is 100% on RA, normal by my interpretation.    COORDINATION OF CARE: 3:27 PM Discussed treatment plan with pt at bedside which includes to perform an I&D procedure. Pt agreed to plan.  INCISION AND DRAINAGE PROCEDURE NOTE: Patient identification was confirmed and verbal consent was obtained. This procedure was performed by Alveta Heimlich, PA-C at 3:33 PM. Site: Right upper abdomen  Sterile procedures  observed Anesthetic used (type and amt): lidocaine 2% with epinephrine  Blade size: 11 Drainage: moderate purulent, bloody drainage  Site anesthetized, incision made over site, wound drained and explored Loculations and covered with dry, sterile dressing.   Pt tolerated procedure well without complications. Instructions for care discussed verbally and pt provided with additional written instructions for homecare and f/u.  MDM   Final diagnoses:  Abscess    Patient presenting with skin abscess of the RUQ amenable to incision and drainage.  Patient tolerated the procedure well. No packing or drain inserted. Antibiotic therapy is not indicated. Encouraged warm soaks at home and keeping the wound clean and dry. Instructed to go to PCP or urgent care in 2 days for wound recheck. Patient expresses understanding and is stable for discharge.    I personally performed the services described in this documentation, which was scribed in my presence. The recorded information has been reviewed and is accurate.    Alveta Heimlich, PA-C 05/27/15 1550  Melene Plan, DO 05/27/15 1631

## 2015-05-27 NOTE — Discharge Instructions (Signed)
Keep your wound clean, dry and covered. Use OTC pain relievers such as tylenol or motrin for pain control. Go to your primary care, an urgent care or return to emergency department in 2 days for a wound check. Return to emergency department sooner with signs of infection   Absceso (Abscess)  Un absceso es una zona infectada que contiene pus y desechos.Puede aparecer en cualquier parte del cuerpo. Tambin se lo conoce como fornculo o divieso. CAUSAS  Ocurre cuando los tejidos se infectan. Tambin puede formarse por obstruccin de las glndulas sebceas o las glndulas sudorparas, infeccin de los folculos pilosos o por una lesin pequea en la piel. A medida que el organismo lucha contra la infeccin, se acumula pus en la zona y hace presin debajo de la piel. Esta presin causa dolor. Las personas con un sistema inmunolgico debilitado tienen dificultad para Industrial/product designer las infecciones y pueden formar abscesos con ms frecuencia.  SNTOMAS  Generalmente un absceso se forma sobre la piel y se vuelve una masa dolorosa, roja, caliente y sensible. Si se forma debajo de la piel, podr sentir como una zona blanda, que se [place], debajo de la piel. Algunos abscesos se abren (ruptura) por s mismos, pero la mayora seguir empeorando si no se lo trata. La infeccin puede diseminarse hacia otros sitios del cuerpo y finalmente al torrente sanguneo y hace que el enfermo se sienta mal.  DIAGNSTICO  El mdico le har una historia clnica y un examen fsico. Podrn tomarle Lauris Poag de lquido del absceso y Public librarian para Clinical research associate la causa de la infeccin. .  TRATAMIENTO  El mdico le indicar antibiticos para combatir la infeccin. Sin embargo, el uso de antibiticos solamente no curar el absceso. El mdico tendr que hacer un pequeo corte (incisin) en el absceso para drenar el pus. En algunos casos se introduce una gasa en el absceso para reducir Chief Technology Officer y que siga drenando la zona.  INSTRUCCIONES  PARA EL CUIDADO EN EL HOGAR   Solo tome medicamentos de venta libre o recetados para Chief Technology Officer, Dentist o fiebre, segn las indicaciones del mdico.  Si le han recetado antibiticos, tmelos segn las indicaciones. Tmelos todos, aunque se sienta mejor.  Si le aplicaron una gasa, siga las indicaciones del mdico para [place].  Para evitar la propagacin de la infeccin:  Mantenga el absceso cubierto con el vendaje.  Lvese bien las manos.  No comparta artculos de cuidado personal, toallas o jacuzzis con los dems.  Evite el contacto con la piel de Economist.  Mantenga la piel y la ropa limpia alrededor del absceso.  Cumpla con todas las visitas de control, segn le indique su mdico. SOLICITE ATENCIN MDICA SI:   Aumenta el dolor, la hinchazn, el enrojecimiento, drena lquido o sangra.  Siente dolores musculares, escalofros, o una sensacin general de Dentist.  Tiene fiebre. ASEGRESE DE QUE:   Comprende estas instrucciones.  Controlar su enfermedad.  Solicitar ayuda de inmediato si no mejora o si empeora.   Esta informacin no tiene Theme park manager el consejo del mdico. Asegrese de hacerle al mdico cualquier pregunta que tenga.   Document Released: 06/20/2005 Document Revised: 12/20/2011 Elsevier Interactive Patient Education [employer]  Incisin y drenaje  (Incision and Drainage) Incisin y drenaje es un procedimiento en el que se abre y se drena Neomia Dear estructura similar a Neomia Dear bolsa (estructura [place]). El rea a drenar generalmente contiene ciertos materiales, como pus, lquido o Retail buyer.  INFORME A SU MDICO SOBRE:  Reaccin alrgica a los medicamentos.  Medicamentos que [place]iza, incluyendo vitaminas, hierbas, gotas oftlmicas, medicamentos de venta libre y cremas.  Uso de corticoides (por va oral o cremas).  Problemas anteriores debido a anestsicos o a medicamentos que Morgan Stanleydisminuyen la sensibilidad.  Antecedentes de hemorragias o  cogulos sanguneos.  Cirugas anteriores.  Otros problemas de salud, incluyendo diabetes y problemas renales.  Posibilidad de embarazo, si corresponde. RIESGOS Y COMPLICACIONES   Dolor.  Sangrado.  Cicatrizacin.  Infecciones. ANTES DEL PROCEDIMIENTO  Es posible que necesite hacerse una ecografa u otras pruebas de diagnstico por imagen para ver el tamao o la profundidad de la estructura qustica. Tambin podrn indicarle anlisis de sangre para determinar si usted padece una infeccin y cul es la gravedad de la [place]ma. Debe aplicarse la vacuna antitetnica.  PROCEDIMIENTO  La zona afectada se higienizar con un lquido especial. Se adormecer el rea con un medicamento (anestesia local). Le harn una pequea incisin en la estructura qustica. Para drenar el contenido de la estructura qustica podrn utilizar una jeringa o un catter, o lo apretarn para que salga hacia afuera. Luego se higieniza la zona con una solucin especial. Despus de la limpieza de la zona, se rellena con una gasa u otro apsito para heridas. Luego se cubre con gasa y Qat[place]inta adhesiva o algn otro apsito para heridas.  DESPUS DEL PROCEDIMIENTO   Generalmente se puede volver a casa el mismo da del procedimiento.  Le podrn recetar antibiticos para prevenir infecciones..  Si la zona fue vendada con gasa o algn otro vendaje para heridas, es probable que Transport plannerdeba volver en 1  2 das para que se lo quiten.  La herida debe curarse en aproximadamente 14 das.   Esta informacin no tiene Theme park managercomo fin reemplazar el consejo del mdico. Asegrese de hacerle al mdico cualquier pregunta que tenga.   Document Released: 06/20/2005 Document Revised: 11/04/2014 Elsevier Interactive Patient Education [employer]2016 Elsevier Inc.

## 2015-05-29 ENCOUNTER — Encounter (HOSPITAL_COMMUNITY): Payer: Self-pay | Admitting: *Deleted

## 2015-05-29 ENCOUNTER — Emergency Department (HOSPITAL_COMMUNITY)
Admission: EM | Admit: 2015-05-29 | Discharge: 2015-05-29 | Disposition: A | Discharge status: Home / Self Care | Payer: Self-pay | Attending: Emergency Medicine | Admitting: Emergency Medicine

## 2015-05-29 DIAGNOSIS — L02211 Cutaneous abscess of abdominal wall: Secondary | ICD-10-CM | POA: Insufficient documentation

## 2015-05-29 DIAGNOSIS — Z79899 Other long term (current) drug therapy: Secondary | ICD-10-CM | POA: Insufficient documentation

## 2015-05-29 DIAGNOSIS — Z8679 Personal history of other diseases of the circulatory system: Secondary | ICD-10-CM | POA: Insufficient documentation

## 2015-05-29 DIAGNOSIS — I10 Essential (primary) hypertension: Secondary | ICD-10-CM | POA: Insufficient documentation

## 2015-05-29 MED ORDER — SULFAMETHOXAZOLE-TRIMETHOPRIM 800-160 MG PO TABS: 1.0000 | ORAL_TABLET | Freq: Two times a day (BID) | ORAL | Status: AC

## 2015-05-29 MED ORDER — CEPHALEXIN 500 MG PO CAPS: 500.0000 mg | ORAL_CAPSULE | Freq: Four times a day (QID) | ORAL | Status: DC

## 2015-05-29 MED ORDER — HYDROCODONE-ACETAMINOPHEN 5-325 MG PO TABS: 1.0000 | ORAL_TABLET | ORAL | Status: DC | PRN

## 2015-05-29 NOTE — Discharge Instructions (Signed)
Take the prescribed medication as directed.  Use caution when taking percocet, it can make you sleepy.   May wish to apply warm compresses to help aid drainage. Follow-up with your primary care physician next week for re-check. Return to the ED for new or worsening symptoms.

## 2015-05-29 NOTE — ED Notes (Signed)
See PA assessment 

## 2015-05-29 NOTE — ED Notes (Signed)
Pt in with possible abscess to RUQ of abdomen, seen for same a few days ago and it was opened up, reports continued pain and drainage since that time

## 2015-05-29 NOTE — ED Provider Notes (Signed)
CSN: [id number]   Arrival date & time 05/29/15 1191  History  By signing my name below, I, [name], attest that this documentation has been prepared under the direction and in the presence of Sharilyn Sites PA-C Electronically Signed: [name], ED Scribe. 05/29/2015. 7:05 PM. Chief Complaint  Patient presents with  . Wound Check    HPI The history is provided by the patient and a relative. A language interpreter was used.   Lisa [name] is a 44 y.o. female who presents to the Emergency Department complaining of ongoing 8/10 in severity pain at the site of an I&D of right upper abdomen that was performed 2 days ago in the ED. She now has pain, increased redness, and continued drainage/bleeding from the site. The pain is worse with position changes. Pt denies fever. She was not started on an abx 2 days ago. NKDA.  The pt is primarily Spanish-speaking. Her son is at the bedside translating.   Past Medical History  Diagnosis Date  . Hypertension   . Migraine     History reviewed. No pertinent past surgical history.  Family History  Problem Relation Age of Onset  . Diabetes Mother   . Hypertension Mother     Social History  Substance Use Topics  . Smoking status: Never Smoker   . Smokeless tobacco: None  . Alcohol Use: No     Review of Systems  Constitutional: Negative for fever.  Skin: Positive for wound (Redness, pain, and bleeding from the site of and I&D. ).  All other systems reviewed and are negative.   Home Medications   Prior to Admission medications   Medication Sig Start Date End Date Taking? Authorizing Provider  cyclobenzaprine (FLEXERIL) 5 MG tablet Take 1 tablet (5 mg total) by mouth 3 (three) times daily as needed for muscle spasms. 02/20/14   Narda Bonds, MD  hydrochlorothiazide 25 MG tablet Take 1 tablet (25 mg total) by mouth daily. 01/31/11 01/31/12  Durwin Reges, MD  ibuprofen (ADVIL,MOTRIN) 600 MG tablet Take 1 tablet (600 mg total) by  mouth every 8 (eight) hours as needed. 02/20/14   Narda Bonds, MD  valACYclovir (VALTREX) 500 MG tablet Take 1 tablet (500 mg total) by mouth daily. 01/31/11 01/31/12  Durwin Reges, MD    Allergies  Review of patient's allergies indicates no known allergies.  Triage Vitals: BP 142/78 mmHg  Pulse 75  Temp(Src) 97.9 F (36.6 C) (Oral)  Resp 18  SpO2 99%  Physical Exam  Constitutional: She is oriented to person, place, and time. She appears well-developed and well-nourished. No distress.  HENT:  Head: Normocephalic and atraumatic.  Mouth/Throat: Oropharynx is clear and moist.  Eyes: Conjunctivae and EOM are normal. Pupils are equal, round, and reactive to light.  Neck: Normal range of motion. Neck supple.  Cardiovascular: Normal rate, regular rhythm and normal heart sounds.   Pulmonary/Chest: Effort normal and breath sounds normal. No respiratory distress. She has no wheezes.  Abdominal: Soft. Bowel sounds are normal. There is no guarding.  Abscess noted to right upper abdomen that is open with purulent drainage; approx 2-3 cm surrounding erythema, induration, and TTP; no streaking; remainder of abdomen is benign  Musculoskeletal: Normal range of motion.  Neurological: She is alert and oriented to person, place, and time.  Skin: Skin is warm and dry. She is not diaphoretic.  Psychiatric: She has a normal mood and affect.  Nursing note and vitals reviewed.   ED Course  Procedures  DIAGNOSTIC STUDIES: Oxygen Saturation is 99% on RA,  normal by my interpretation.    COORDINATION OF CARE: 7:00 PM Discussed treatment plan which includes discharge with abx and pain medication with pt at bedside and pt agreed to the plan.  Labs Review- Labs Reviewed - No data to display  Imaging Review No results found.  MDM   Final diagnoses:  Abdominal wall abscess   44 year old female here for recheck of right upper abdominal abscess. Abscess was drained 2 days ago in the ED, patient was  not started on antibiotics or pain medication. Abscess remains open and is freely draining purulent fluid. She appears to have surrounding cellulitis of open abscess. She remains afebrile, nontoxic.  Abscess is in a very atypical location, therefore feel like antibiotics are warranted. Will start her on combination of Bactrim and Keflex. Percocet given for pain. Encouraged warm compresses/soaks.  Will follow-up with her PCP next week for re-check.  Discussed plan with patient, he/she acknowledged understanding and agreed with plan of care.  Return precautions given for new or worsening symptoms.  I personally performed the services described in this documentation, which was scribed in my presence. The recorded information has been reviewed and is accurate.  Garlon HatchetLisa M Denise Bramblett, PA-C 05/29/15 1929  Arby BarretteMarcy Pfeiffer, MD 06/01/15 1425

## 2016-06-21 ENCOUNTER — Encounter (HOSPITAL_COMMUNITY): Payer: Self-pay | Admitting: Emergency Medicine

## 2016-06-21 ENCOUNTER — Emergency Department (HOSPITAL_COMMUNITY)
Admission: EM | Admit: 2016-06-21 | Discharge: 2016-06-21 | Disposition: A | Discharge status: Home / Self Care | Payer: Self-pay | Attending: Emergency Medicine | Admitting: Emergency Medicine

## 2016-06-21 ENCOUNTER — Emergency Department (HOSPITAL_COMMUNITY): Payer: Self-pay

## 2016-06-21 DIAGNOSIS — F410 Panic disorder [episodic paroxysmal anxiety] without agoraphobia: Secondary | ICD-10-CM | POA: Insufficient documentation

## 2016-06-21 DIAGNOSIS — Z79899 Other long term (current) drug therapy: Secondary | ICD-10-CM | POA: Insufficient documentation

## 2016-06-21 DIAGNOSIS — I10 Essential (primary) hypertension: Secondary | ICD-10-CM | POA: Insufficient documentation

## 2016-06-21 LAB — I-STAT CHEM 8, ED
BUN: <3 mg/dL — ABNORMAL LOW (ref 6–20)
Calcium, Ion: 1.18 mmol/L (ref 1.15–1.40)
Chloride: 92 mmol/L — ABNORMAL LOW (ref 101–111)
Creatinine, Ser: 0.5 mg/dL (ref 0.44–1.00)
GLUCOSE: 131 mg/dL — AB (ref 65–99)
HCT: 41 % (ref 36.0–46.0)
HEMOGLOBIN: 13.9 g/dL (ref 12.0–15.0)
Potassium: 3.9 mmol/L (ref 3.5–5.1)
Sodium: 140 mmol/L (ref 135–145)
TCO2: 24 mmol/L (ref 0–100)

## 2016-06-21 LAB — I-STAT TROPONIN, ED: Troponin i, poc: 0 ng/mL (ref 0.00–0.08)

## 2016-06-21 MED ORDER — LORAZEPAM 0.5 MG PO TABS: 0.5000 mg | ORAL_TABLET | Freq: Three times a day (TID) | ORAL | 0 refills | Status: DC | PRN

## 2016-06-21 NOTE — ED Provider Notes (Signed)
[facility]-EMERGENCY DEPT Provider Note   CSN: [id number] Arrival date & time: 06/21/16  0100     History   Chief Complaint Chief Complaint  Patient presents with  . Anxiety    HPI Lisa [name] is a 45 y.o. female.  Patient without significant medical history presents for evaluation of chest pain and SOB that started tonight when she went to lay down to sleep. She reports her symptoms as central chest pressure like pain, not being able to catch her breath. Symptoms were sudden in onset and have completely resolved at this time without intervention. No nausea or vomiting. No history of anxiety or panic previously. Per son, and per EMS, patient appeared to by hyperventilating.    The history is provided by the patient and a relative. A language interpreter was used Garment/textile technologist(Interpreter is relative at bedside.).  Anxiety  Associated symptoms include chest pain and shortness of breath.    Past Medical History:  Diagnosis Date  . Hypertension   . Migraine     Patient Active Problem List   Diagnosis Date Noted  . Menorrhagia with regular cycle 03/13/2014  . Back pain 02/28/2014  . Dizziness 02/28/2014  . Muscle strain 02/20/2014  . 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 Depressive disorder 02/01/2011  . Fatigue 11/22/2010  . OBESITY, NOS 08/31/2006  . MIGRAINE, UNSPEC., W/O INTRACTABLE MIGRAINE 08/31/2006  . HYPERTENSION, BENIGN SYSTEMIC 08/31/2006    History reviewed. No pertinent surgical history.  OB History    No data available       Home Medications    Prior to Admission medications   Medication Sig Start Date End Date Taking? Authorizing Provider  cephALEXin (KEFLEX) 500 MG capsule Take 1 capsule (500 mg total) by mouth 4 (four) times daily. 05/29/15   Garlon HatchetLisa M Sanders, PA-C  cyclobenzaprine (FLEXERIL) 5 MG tablet Take 1 tablet (5 mg total) by mouth 3 (three) times daily as needed for muscle spasms. 02/20/14   Narda Bondsalph A Nettey, MD  hydrochlorothiazide 25 MG tablet Take  1 tablet (25 mg total) by mouth daily. 01/31/11 01/31/12  Durwin RegesJill N Konkol, MD  HYDROcodone-acetaminophen (NORCO/VICODIN) 5-325 MG tablet Take 1 tablet by mouth every 4 (four) hours as needed. 05/29/15   Garlon HatchetLisa M Sanders, PA-C  ibuprofen (ADVIL,MOTRIN) 600 MG tablet Take 1 tablet (600 mg total) by mouth every 8 (eight) hours as needed. 02/20/14   Narda Bondsalph A Nettey, MD  valACYclovir (VALTREX) 500 MG tablet Take 1 tablet (500 mg total) by mouth daily. 01/31/11 01/31/12  Durwin RegesJill N Konkol, MD    Family History Family History  Problem Relation Age of Onset  . Diabetes Mother   . Hypertension Mother     Social History Social History  Substance Use Topics  . Smoking status: Never Smoker  . Smokeless tobacco: Never Used  . Alcohol use No     Allergies   Patient has no known allergies.   Review of Systems Review of Systems  Constitutional: Negative for chills, diaphoresis and fever.  Respiratory: Positive for shortness of breath.   Cardiovascular: Positive for chest pain.  Gastrointestinal: Negative.  Negative for nausea and vomiting.  Musculoskeletal: Negative.  Negative for myalgias.  Neurological: Negative.      Physical Exam Updated Vital Signs BP 148/99   Pulse 79   Temp 98 F (36.7 C) (Oral)   Resp 12   Ht 5\' 1"  (1.549 m)   Wt 81.6 kg   SpO2 100%   BMI 33.97 kg/m   Physical  Exam  Constitutional: She is oriented to person, place, and time. She appears well-developed and well-nourished. No distress.  HENT:  Head: Normocephalic.  Neck: Normal range of motion. Neck supple.  Cardiovascular: Normal rate and regular rhythm.   No murmur heard. Pulmonary/Chest: Effort normal and breath sounds normal. She has no wheezes. She has no rales.  Abdominal: Soft. Bowel sounds are normal. There is no tenderness. There is no rebound and no guarding.  Musculoskeletal: Normal range of motion. She exhibits no edema.  Neurological: She is alert and oriented to person, place, and time.  Skin: Skin  is warm and dry. No rash noted.  Psychiatric: She has a normal mood and affect.     ED Treatments / Results  Labs (all labs ordered are listed, but only abnormal results are displayed) Labs Reviewed  I-STAT CHEM 8, ED - Abnormal; Notable for the following:       Result Value   Chloride 92 (*)    BUN <3 (*)    Glucose, Bld 131 (*)    All other components within normal limits  I-STAT TROPOININ, ED   Results for orders placed or performed during the hospital encounter of 06/21/16  I-stat Chem 8, ED  Result Value Ref Range   Sodium 140 135 - 145 mmol/L   Potassium 3.9 3.5 - 5.1 mmol/L   Chloride 92 (L) 101 - 111 mmol/L   BUN <3 (L) 6 - 20 mg/dL   Creatinine, Ser 2.950.50 0.44 - 1.00 mg/dL   Glucose, Bld 621131 (H) 65 - 99 mg/dL   Calcium, Ion 3.081.18 6.571.15 - 1.40 mmol/L   TCO2 24 0 - 100 mmol/L   Hemoglobin 13.9 12.0 - 15.0 g/dL   HCT 84.641.0 96.236.0 - 95.246.0 %  I-stat troponin, ED  Result Value Ref Range   Troponin i, poc 0.00 0.00 - 0.08 ng/mL   Comment 3             EKG  EKG Interpretation  Date/Time:  Tuesday June 21 2016 04:39:11 EST Ventricular Rate:  74 PR Interval:    QRS Duration: 87 QT Interval:  367 QTC Calculation: 408 R Axis:   91 Text Interpretation:  Sinus rhythm Borderline right axis deviation ST elev, probable normal early repol pattern Confirmed by Wilkie AyeHORTON  MD, COURTNEY ([id number]) on 06/21/2016 5:07:05 AM       Radiology Dg Chest 2 View  Result Date: 06/21/2016 CLINICAL DATA:  Initial evaluation for acute shortness of breath. EXAM: CHEST  2 VIEW COMPARISON:  None available. FINDINGS: Cardiac and mediastinal silhouettes are within normal limits. Lungs are mildly hypoinflated. Minimal left basilar subsegmental atelectasis. No focal infiltrates. No pulmonary edema or pleural effusion. No pneumothorax. No acute osseous abnormality. IMPRESSION: Shallow lung inflation with minimal subsegmental atelectasis at the left lung base. No other active cardiopulmonary disease.  Electronically Signed   By: Rise MuBenjamin  McClintock M.D.   On: 06/21/2016 05:03    Procedures Procedures (including critical care time)  Medications Ordered in ED Medications - No data to display   Initial Impression / Assessment and Plan / ED Course  I have reviewed the triage vital signs and the nursing notes.  Pertinent labs & imaging results that were available during my care of the patient were reviewed by me and considered in my medical decision making (see chart for details).  Clinical Course     Patient presents with CP, SOB, sudden onset, including hyperventilation. Son reports she is under significant stress. No history of anxiety.  Symptoms resolves shortly after onset. Nonacute EKG, negative troponin and CXR. Asymptomatic now. Unlikely to be ACS, favor panic. She is stable for discharge home.   Final Clinical Impressions(s) / ED Diagnoses   Final diagnoses:  None   1. Anxiety/panic  New Prescriptions New Prescriptions   No medications on file     Elpidio Anis, PA-C 06/21/16 1610    Shon Baton, MD 06/22/16 [phone]

## 2016-06-21 NOTE — ED Triage Notes (Signed)
Patient with history of anxiety and hypertension.  Patient has recently been taken off meds for hypertension.  She does not take any meds for anxiety.  Patient was hyperventilating upon EMS arrival.  Patient calmed en route to ED.

## 2016-06-21 NOTE — ED Notes (Signed)
Pt to xray

## 2016-07-03 ENCOUNTER — Emergency Department (HOSPITAL_COMMUNITY): Payer: Self-pay

## 2016-07-03 ENCOUNTER — Encounter (HOSPITAL_COMMUNITY): Payer: Self-pay | Admitting: Emergency Medicine

## 2016-07-03 ENCOUNTER — Emergency Department (HOSPITAL_COMMUNITY)
Admission: EM | Admit: 2016-07-03 | Discharge: 2016-07-03 | Disposition: A | Discharge status: Home / Self Care | Payer: Self-pay | Attending: Emergency Medicine | Admitting: Emergency Medicine

## 2016-07-03 DIAGNOSIS — G479 Sleep disorder, unspecified: Secondary | ICD-10-CM | POA: Insufficient documentation

## 2016-07-03 DIAGNOSIS — Z79899 Other long term (current) drug therapy: Secondary | ICD-10-CM | POA: Insufficient documentation

## 2016-07-03 DIAGNOSIS — R531 Weakness: Secondary | ICD-10-CM | POA: Insufficient documentation

## 2016-07-03 DIAGNOSIS — R002 Palpitations: Secondary | ICD-10-CM | POA: Insufficient documentation

## 2016-07-03 DIAGNOSIS — I10 Essential (primary) hypertension: Secondary | ICD-10-CM | POA: Insufficient documentation

## 2016-07-03 DIAGNOSIS — R42 Dizziness and giddiness: Secondary | ICD-10-CM | POA: Insufficient documentation

## 2016-07-03 DIAGNOSIS — R51 Headache: Secondary | ICD-10-CM | POA: Insufficient documentation

## 2016-07-03 LAB — COMPREHENSIVE METABOLIC PANEL
ALT: 42 U/L (ref 14–54)
ANION GAP: 7 (ref 5–15)
AST: 31 U/L (ref 15–41)
Albumin: 4.6 g/dL (ref 3.5–5.0)
Alkaline Phosphatase: 63 U/L (ref 38–126)
BUN: 11 mg/dL (ref 6–20)
CO2: 26 mmol/L (ref 22–32)
Calcium: 9.4 mg/dL (ref 8.9–10.3)
Chloride: 107 mmol/L (ref 101–111)
Creatinine, Ser: 0.55 mg/dL (ref 0.44–1.00)
GFR calc non Af Amer: >60 mL/min (ref >60)
Glucose, Bld: 109 mg/dL — ABNORMAL HIGH (ref 65–99)
Potassium: 3.7 mmol/L (ref 3.5–5.1)
SODIUM: 140 mmol/L (ref 135–145)
Total Bilirubin: 1.1 mg/dL (ref 0.3–1.2)
Total Protein: 8 g/dL (ref 6.5–8.1)

## 2016-07-03 LAB — URINALYSIS, ROUTINE W REFLEX MICROSCOPIC
BILIRUBIN URINE: NEGATIVE
Bacteria, UA: NONE SEEN
Glucose, UA: NEGATIVE mg/dL
Hgb urine dipstick: NEGATIVE
KETONES UR: NEGATIVE mg/dL
Leukocytes, UA: NEGATIVE
Nitrite: NEGATIVE
PROTEIN: NEGATIVE mg/dL
Specific Gravity, Urine: 1.005 (ref 1.005–1.030)
pH: 8 (ref 5.0–8.0)

## 2016-07-03 LAB — CBC
HCT: 43.8 % (ref 36.0–46.0)
HEMOGLOBIN: 14.8 g/dL (ref 12.0–15.0)
MCH: 30.8 pg (ref 26.0–34.0)
MCHC: 33.8 g/dL (ref 30.0–36.0)
MCV: 91.3 fL (ref 78.0–100.0)
Platelets: 263 10*3/uL (ref 150–400)
RBC: 4.8 MIL/uL (ref 3.87–5.11)
RDW: 12.3 % (ref 11.5–15.5)
WBC: 5.5 10*3/uL (ref 4.0–10.5)

## 2016-07-03 LAB — I-STAT BETA HCG BLOOD, ED (MC, WL, AP ONLY): I-stat hCG, quantitative: <5 m[IU]/mL (ref <5)

## 2016-07-03 LAB — LIPASE, BLOOD: LIPASE: 35 U/L (ref 11–51)

## 2016-07-03 MED ORDER — ONDANSETRON HCL 4 MG/2ML IJ SOLN
4.0000 mg | Freq: Once | INTRAMUSCULAR | Status: AC
  Administered 2016-07-03: 4 mg via INTRAVENOUS
  Filled 2016-07-03: qty 2

## 2016-07-03 MED ORDER — MECLIZINE HCL 25 MG PO TABS: ORAL_TABLET | ORAL | 0 refills | Status: DC

## 2016-07-03 MED ORDER — MECLIZINE HCL 12.5 MG PO TABS
25.0000 mg | ORAL_TABLET | Freq: Once | ORAL | Status: AC
  Administered 2016-07-03: 25 mg via ORAL
  Filled 2016-07-03: qty 2

## 2016-07-03 MED ORDER — SODIUM CHLORIDE 0.9 % IV SOLN: 1000.0000 mL | INTRAVENOUS | Status: DC

## 2016-07-03 MED ORDER — SODIUM CHLORIDE 0.9 % IV BOLUS (SEPSIS)
1000.0000 mL | Freq: Once | INTRAVENOUS | Status: AC
  Administered 2016-07-03: 1000 mL via INTRAVENOUS

## 2016-07-03 MED ORDER — SODIUM CHLORIDE 0.9 % IV SOLN
1000.0000 mL | Freq: Once | INTRAVENOUS | Status: AC
  Administered 2016-07-03: 1000 mL via INTRAVENOUS

## 2016-07-03 NOTE — ED Triage Notes (Signed)
Pt reports dizziness and emesis since last Monday, denies blurred vision, h/a.  Pt reports weakness "all over body." Family denies confusion.   Pt alert and oriented. Family reports she had sob on Monday, none at this time.

## 2016-07-03 NOTE — Discharge Instructions (Signed)
Follow up with your md this week for recheck  °

## 2016-07-03 NOTE — ED Provider Notes (Signed)
[facility]-EMERGENCY DEPT Provider Note   CSN: [id number] Arrival date & time: 07/03/16  1113  By signing my name below, I, Sonum Patel, attest that this documentation has been prepared under the direction and in the presence of Bethann BerkshireJoseph Arion Morgan, MD. Electronically Signed: Sonum Patel, Neurosurgeoncribe. 07/03/16. 12:28 PM.  History   Chief Complaint Chief Complaint  Patient presents with  . Dizziness    The history is provided by the patient. No language interpreter was used.  Dizziness  Quality:  Room spinning Severity:  Mild Duration:  2 weeks Timing:  Constant Progression:  Unchanged Chronicity:  New Associated symptoms: headaches, palpitations and weakness   Associated symptoms: no nausea and no vomiting      HPI Comments: Lisa [name] is a 45 y.o. female who presents to the Emergency Department complaining of persistent dizziness and generalized weakness for the past 2 weeks. She describes the dizziness as room spinning. She reports associated palpitations for the last 1 week, difficulty sleeping, and HA.  She denies cough, fever, nausea, abdominal pain.   Past Medical History:  Diagnosis Date  . Hypertension   . Migraine     Patient Active Problem List   Diagnosis Date Noted  . Menorrhagia with regular cycle 03/13/2014  . Back pain 02/28/2014  . Dizziness 02/28/2014  . Muscle strain 02/20/2014  . 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 Depressive disorder 02/01/2011  . Fatigue 11/22/2010  . OBESITY, NOS 08/31/2006  . MIGRAINE, UNSPEC., W/O INTRACTABLE MIGRAINE 08/31/2006  . HYPERTENSION, BENIGN SYSTEMIC 08/31/2006    History reviewed. No pertinent surgical history.  OB History    No data available       Home Medications    Prior to Admission medications   Medication Sig Start Date End Date Taking? Authorizing Provider  cephALEXin (KEFLEX) 500 MG capsule Take 1 capsule (500 mg total) by mouth 4 (four) times daily. 05/29/15   Garlon HatchetLisa M Sanders, PA-C  cyclobenzaprine  (FLEXERIL) 5 MG tablet Take 1 tablet (5 mg total) by mouth 3 (three) times daily as needed for muscle spasms. 02/20/14   Narda Bondsalph A Nettey, MD  hydrochlorothiazide 25 MG tablet Take 1 tablet (25 mg total) by mouth daily. 01/31/11 01/31/12  Durwin RegesJill N Konkol, MD  HYDROcodone-acetaminophen (NORCO/VICODIN) 5-325 MG tablet Take 1 tablet by mouth every 4 (four) hours as needed. 05/29/15   Garlon HatchetLisa M Sanders, PA-C  ibuprofen (ADVIL,MOTRIN) 600 MG tablet Take 1 tablet (600 mg total) by mouth every 8 (eight) hours as needed. 02/20/14   Narda Bondsalph A Nettey, MD  LORazepam (ATIVAN) 0.5 MG tablet Take 1 tablet (0.5 mg total) by mouth 3 (three) times daily as needed for anxiety. 06/21/16   Elpidio AnisShari Upstill, PA-C  valACYclovir (VALTREX) 500 MG tablet Take 1 tablet (500 mg total) by mouth daily. 01/31/11 01/31/12  Durwin RegesJill N Konkol, MD    Family History Family History  Problem Relation Age of Onset  . Diabetes Mother   . Hypertension Mother     Social History Social History  Substance Use Topics  . Smoking status: Never Smoker  . Smokeless tobacco: Never Used  . Alcohol use No     Allergies   Patient has no known allergies.   Review of Systems Review of Systems  Constitutional: Negative for fever.  Respiratory: Negative for cough.   Cardiovascular: Positive for palpitations.  Gastrointestinal: Negative for nausea and vomiting.  Neurological: Positive for dizziness, weakness and headaches.  Psychiatric/Behavioral: Positive for sleep disturbance.  All other systems reviewed and are  negative.    Physical Exam Updated Vital Signs BP 157/83 (BP Location: Left Arm)   Pulse 84   Temp 97.7 F (36.5 C) (Oral)   Resp 18   Ht 5\' 1"  (1.549 m)   Wt 180 lb (81.6 kg)   LMP 06/02/2016 (Exact Date)   SpO2 100%   BMI 34.01 kg/m   Physical Exam  Constitutional: She is oriented to person, place, and time. She appears well-developed and well-nourished.  HENT:  Head: Normocephalic.  Eyes: Conjunctivae and EOM are normal. No  scleral icterus.  Neck: Neck supple. No thyromegaly present.  Cardiovascular: Normal rate, regular rhythm and normal heart sounds.  Exam reveals no gallop and no friction rub.   No murmur heard. Pulmonary/Chest: Effort normal and breath sounds normal. No stridor. She has no wheezes. She has no rales. She exhibits no tenderness.  Abdominal: Soft. She exhibits no distension. There is no tenderness. There is no rebound.  Musculoskeletal: Normal range of motion. She exhibits no edema.  Lymphadenopathy:    She has no cervical adenopathy.  Neurological: She is alert and oriented to person, place, and time. She exhibits normal muscle tone. Coordination normal.  Skin: No rash noted. No erythema.  Psychiatric: She has a normal mood and affect. Her behavior is normal.  Nursing note and vitals reviewed.    ED Treatments / Results  DIAGNOSTIC STUDIES: Oxygen Saturation is 100% on RA, normal by my interpretation.    COORDINATION OF CARE: 12:28 PM Discussed treatment plan with pt at bedside and pt agreed to plan.   Labs (all labs ordered are listed, but only abnormal results are displayed) Labs Reviewed  CBC  LIPASE, BLOOD  COMPREHENSIVE METABOLIC PANEL  I-STAT BETA HCG BLOOD, ED ([facility], [facility], [facility] ONLY)    EKG  EKG Interpretation None       Radiology No results found.  Procedures Procedures (including critical care time)  Medications Ordered in ED Medications  0.9 %  sodium chloride infusion (1,000 mLs Intravenous New Bag/Given 07/03/16 1156)    Followed by  0.9 %  sodium chloride infusion (not administered)  ondansetron (ZOFRAN) injection 4 mg (4 mg Intravenous Given 07/03/16 1156)     Initial Impression / Assessment and Plan / ED Course  I have reviewed the triage vital signs and the nursing notes.  Pertinent labs & imaging results that were available during my care of the patient were reviewed by me and considered in my medical decision making (see chart for  details).  Clinical Course     Labs and ct neg.  tx vertigo with antivert.  Pt to follow up with pcp  Final Clinical Impressions(s) / ED Diagnoses   Final diagnoses:  None    New Prescriptions New Prescriptions   No medications on file   The chart was scribed for me under my direct supervision.  I personally performed the history, physical, and medical decision making and all procedures in the evaluation of this patient.Bethann Berkshire.    Mavis Gravelle, MD 07/03/16 [phone]

## 2016-07-11 ENCOUNTER — Ambulatory Visit: Payer: Self-pay | Attending: Family Medicine | Admitting: Family Medicine

## 2016-07-11 ENCOUNTER — Encounter: Payer: Self-pay | Admitting: Family Medicine

## 2016-07-11 VITALS — BP 143/82 | HR 64 | Temp 97.8°F | Resp 18 | Ht 60.0 in | Wt 171.0 lb

## 2016-07-11 DIAGNOSIS — H9313 Tinnitus, bilateral: Secondary | ICD-10-CM | POA: Insufficient documentation

## 2016-07-11 DIAGNOSIS — R42 Dizziness and giddiness: Secondary | ICD-10-CM | POA: Insufficient documentation

## 2016-07-11 LAB — CBC WITH DIFFERENTIAL/PLATELET
Basophils Absolute: 0 cells/uL (ref 0–200)
Basophils Relative: 0 %
Eosinophils Absolute: 118 cells/uL (ref 15–500)
Eosinophils Relative: 2 %
HCT: 41.6 % (ref 35.0–45.0)
Hemoglobin: 13.9 g/dL (ref 11.7–15.5)
LYMPHS ABS: 2655 {cells}/uL (ref 850–3900)
LYMPHS PCT: 45 %
MCH: 30.2 pg (ref 27.0–33.0)
MCHC: 33.4 g/dL (ref 32.0–36.0)
MCV: 90.4 fL (ref 80.0–100.0)
MPV: 10.4 fL (ref 7.5–12.5)
Monocytes Absolute: 472 cells/uL (ref 200–950)
Monocytes Relative: 8 %
NEUTROS PCT: 45 %
Neutro Abs: 2655 cells/uL (ref 1500–7800)
Platelets: 266 10*3/uL (ref 140–400)
RBC: 4.6 MIL/uL (ref 3.80–5.10)
RDW: 13.2 % (ref 11.0–15.0)
WBC: 5.9 10*3/uL (ref 3.8–10.8)

## 2016-07-11 MED ORDER — DIMENHYDRINATE 50 MG PO TABS: 50.0000 mg | ORAL_TABLET | Freq: Three times a day (TID) | ORAL | 0 refills | Status: DC | PRN

## 2016-07-11 NOTE — Patient Instructions (Signed)
Mareos (Dizziness) Los mareos son un problema muy frecuente. Causan sensacin de inestabilidad o de desvanecimiento. Puede sentir que se va a desmayar. Un mareo puede provocarle una lesin si se tropieza o se cae. Cualquier persona puede marearse, pero los Lan[place]mareos son ms frecuentes en los ONEOKadultos mayores. Esta afeccin puede tener muchas causas, por ejemplo:  Medicamentos.  Deshidratacin.  Enfermedad. CUIDADOS EN EL HOGAR Estas indicaciones pueden ayudarlo con el trastorno: Comida y bebida   Beba suficiente lquido para Pharmacologistmantener el pis (orina) claro o de color amarillo plido. Esto evita la deshidratacin. Trate de beber ms lquidos transparentes, como agua.  No beba alcohol.  Limite la cantidad de cafena que bebe o come si el mdico se lo indic.  Limite la cantidad de sal que bebe o come si el mdico se lo indic. Actividad   Evite los movimientos rpidos.  Cuando se levante de una silla, sujtese hasta sentirse bien.  Por la maana, sintese primero a un lado de la cama. Cuando se sienta bien, pngase lentamente de [address] mientras se sostiene de algo, hasta que sepa que ha logrado el equilibrio.  Mueva las piernas con frecuencia si debe estar de pie en un lugar durante mucho tiempo. Mientras est de pie, contraiga y relaje los msculos de las piernas.  No conduzca vehculos ni utilice maquinarias pesadas si se siente mareado.  Evite agacharse si se siente mareado. En su casa, coloque los objetos de modo que le resulte fcil alcanzarlos sin Public librarianagacharse. Estilo de vida   No consuma ningn producto que contenga tabaco, lo que incluye cigarrillos, tabaco de Theatre managermascar o Administrator, Civil Servicecigarrillos electrnicos. Si necesita ayuda para dejar de fumar, consulte al American Expressmdico.  Trate de reducir el nivel de estrs practicando actividades como el yoga o la meditacin. Hable con el mdico si necesita ayuda. Instrucciones generales   Controle sus mareos para ver si hay cambios.  Tome los medicamentos  solamente como se lo haya indicado el mdico. Hable con el mdico si cree que algn medicamento que est tomando es la causa de sus [place]mareos.  Infrmele a un amigo o a un familiar si se siente mareado. Pdale a esta persona que llame al mdico si observa cambios en su comportamiento.  Concurra a todas las visitas de control como se lo haya indicado el mdico. Esto es importante. SOLICITE AYUDA SI:  Los American Expressmareos persisten.  Los Golden West Financialmareos o la sensacin de Production assistant, radiodesvanecimiento empeoran.  Siente malestar estomacal (nuseas).  Tiene problemas para escuchar.  Aparecen nuevos sntomas.  Cuando est de pie se siente inestable o que la habitacin da vueltas. SOLICITE AYUDA DE INMEDIATO SI:  Vomita o tiene diarrea y no puede comer ni beber nada.  Tiene dificultad para lo siguiente:  Hablar.  Caminar.  Tragar.  Usar los brazos, las manos o las piernas.  Siente una debilidad generalizada.  No piensa con claridad o tiene dificultades para armar oraciones. Es posible que un amigo o un familiar adviertan que esto ocurre.  Tiene los siguientes sntomas:  Journalist, newspaperDolor en el pecho.  Dolor en el vientre (abdomen).  Falta de aire.  Sudoracin.  Cambios en la visin.  Hemorragias.  Dolores de Turkmenistancabeza.  Dolor o rigidez en el cuello.  Grant RutsFiebre. Esta informacin no tiene Theme park managercomo fin reemplazar el consejo del mdico. Asegrese de hacerle al mdico cualquier pregunta que tenga. Document Released: 06/09/2011 Document Revised: 11/04/2014 Document Reviewed: 06/16/2014 Elsevier Interactive Patient Education  2017 [employer] Tinnitus (Tinnitus) El trmino tinnitus hace referencia a la percepcin de un sonido  que no se corresponde con ninguna fuente real para ese sonido. A menudo se lo describe como zumbido de odos. Sin embargo, las personas que sufren esta afeccin pueden percibir diferentes ruidos. Una persona puede percibir el sonido en uno o en ambos odos. Los sonidos del tinnitus pueden ser [place],  fuertes o de intensidad intermedia. El tinnitus puede prolongarse pocos segundos o ser constante durante [address]. Puede desaparecer sin tratamiento y regresar en distintos momentos. Cuando el tinnitus es permanente u ocurre con frecuencia, puede causar otros problemas, por ejemplo, dificultad para dormir y para concentrarse. Casi todas las Environmental health practitioner tinnitus en algn momento. El tinnitus a Air cabin crew (crnico) o que regresa con frecuencia es un problema que puede requerir Psychologist, prison and probation services. CAUSAS A menudo se desconoce la causa del tinnitus. En algunos casos, puede ser [employer] de otros problemas u otras afecciones, entre ellas:  Exposicin a ruidos fuertes de [place], [place] u otras fuentes.  Prdida auditiva.  Infecciones de los odos o de los senos paranasales.  Acumulacin de cerumen.  Un objeto extrao en el odo.  Uso de ciertos medicamentos.  Consumo de alcohol y cafena.  Hipertensin arterial.  Cardiopatas.  Anemia.  Alergias.  Enfermedad de [place].  Problemas de tiroides.  Tumores.  Dilatacin de una porcin de un vaso sanguneo debilitado (aneurisma). SNTOMAS El principal sntoma de tinnitus es la percepcin de un sonido que no se corresponde con ninguna fuente, no proviene de Dealer. El sonido puede percibirse como lo siguiente:  Timbre.  Crepitacin.  Zumbido.  El soplido del aire, similar al sonido que se percibe en una caracola.  Sibilancia.  Silbido.  Chisporroteo.  Runrn.  Una corriente de agua.  Una nota musical sostenida. DIAGNSTICO El diagnstico de tinnitus se basa en los sntomas. El [employer] har un examen fsico. Se realizar un examen auditivo exhaustivo (audiometra) si el tinnitus:  Afecta un solo odo (unilateral).  Causa dificultades [id number].  Dura ms de . Adems, tal vez deba consultar a un mdico especialista en trastornos auditivos (audilogo). Pueden pedirle que responda un  cuestionario para determinar la gravedad del tinnitus que padece. Se pueden hacer estudios para ayudar a Production assistant, radio causa y Sales promotion account executive otras enfermedades. Estos pueden incluir los siguientes:  Estudios de diagnstico por imgenes de la cabeza y el cerebro, por ejemplo:  Tomografa computarizada.  Resonancia magntica.  Un estudio de diagnstico por imgenes de los vasos sanguneos (angiografa). TRATAMIENTO A veces, el tratamiento de una enfermedad preexistente hace que el tinnitus desaparezca. Si el tinnitus contina, probablemente debe realizar [employer] tratamientos, [place] otros:  Medicamentos, como determinados antidepresivos o pastillas para dormir.  Generadores de sonido para enmascarar el tinnitus. Estos incluyen los siguientes:  Aparatos de sonido de mesa que reproducen sonidos relajantes para ayudarlo a dormir.  Dispositivos inteligentes que se adaptan al odo y reproducen sonidos o [place].  Un pequeo dispositivo que [place] auriculares para emitir una seal con msica (estimulacin Designer, industrial/product). Con el tiempo, esto puede modificar las redes del cerebro y reducir la sensibilidad al tinnitus. Este dispositivo se [place] en los casos muy graves cuando ningn otro tratamiento resulta eficaz.  Terapia y orientacin para ayudarlo a controlar el estrs que significa vivir con tinnitus.  El uso de audfonos o implantes cocleares, si el tinnitus guarda relacin con la prdida de la audicin. INSTRUCCIONES PARA EL CUIDADO EN EL HOGAR  Cuando sea posible, no permanezca en lugares ruidosos y no se exponga a sonidos fuertes.  Use dispositivos  de proteccin de la audicin, por ejemplo, tapones, cuando est expuesto a ruidos fuertes.  No consuma sustancias estimulantes, como nicotina, alcohol o cafena.  Ponga en prctica tcnicas para reducir el estrs, como meditacin, yoga o respiracin profunda.  Use un aparato de sonido de fondo, un humidificador u otros  dispositivos para enmascarar el sonido del tinnitus.  Duerma con la cabeza levemente elevada. Esto puede reducir el impacto del tinnitus.  Intente descansar lo suficiente todas las noches. SOLICITE ATENCIN MDICA SI:  Tiene tinnitus en un solo odo.  El tinnitus se prolonga durante 3semanas o ms tiempo y no se detiene.  Las medidas de cuidados en el hogar no Comptroller.  Tiene tinnitus despus de sufrir una lesin en la cabeza.  Tiene tinnitus junto con alguno de estos sntomas:  Mareos.  Prdida del equilibrio.  Nuseas y vmitos. Esta informacin no tiene Theme park manager el consejo del mdico. Asegrese de hacerle al mdico cualquier pregunta que tenga. Document Released: 06/20/2005 Document Revised: 07/11/2014 Document Reviewed: 11/20/2013 Elsevier Interactive Patient Education  2017 [employer].

## 2016-07-11 NOTE — Progress Notes (Signed)
Patient is here for establish care.  Patient stated that she was taking meclizine but she stop because she felt that it was making her more dizziness than she already is.   She been having some dizziness problem like 3 weeks ago. Only when standing, she don't have any appetite.  Also patient stated that her neck feels like if she is carrying something at the back of her neck.  Also patient stated that she felt shortness of breath a week ago but not no more.

## 2016-07-11 NOTE — Progress Notes (Signed)
Subjective:  Patient ID: Lisa [name], female    DOB: 07-22-1970  Age: 46 y.o. MRN: [id number]  CC: Establish Care  HPI Lisa [name] presents for dizziness and tinnitus. Reports history of vertigo in the past with use of meclizine. Reports symptoms worsened 3 weeks ago. Stopped taking meclizine 3 days ago due to believing the medication was making her dizziness worse. C/o ringing in the hear that prevents her from sleeping at night. Denies difficulty hearing. Reports nausea denies vomiting.   Outpatient Medications Prior to Visit  Medication Sig Dispense Refill  . cephALEXin (KEFLEX) 500 MG capsule Take 1 capsule (500 mg total) by mouth 4 (four) times daily. (Patient not taking: Reported on 07/11/2016) 40 capsule 0  . cyclobenzaprine (FLEXERIL) 5 MG tablet Take 1 tablet (5 mg total) by mouth 3 (three) times daily as needed for muscle spasms. (Patient not taking: Reported on 07/11/2016) 30 tablet 0  . hydrochlorothiazide 25 MG tablet Take 1 tablet (25 mg total) by mouth daily. 30 tablet 4  . HYDROcodone-acetaminophen (NORCO/VICODIN) 5-325 MG tablet Take 1 tablet by mouth every 4 (four) hours as needed. (Patient not taking: Reported on 07/11/2016) 15 tablet 0  . ibuprofen (ADVIL,MOTRIN) 600 MG tablet Take 1 tablet (600 mg total) by mouth every 8 (eight) hours as needed. (Patient not taking: Reported on 07/11/2016) 30 tablet 0  . LORazepam (ATIVAN) 0.5 MG tablet Take 1 tablet (0.5 mg total) by mouth 3 (three) times daily as needed for anxiety. (Patient not taking: Reported on 07/11/2016) 5 tablet 0  . meclizine (ANTIVERT) 25 MG tablet Take one every 6 hours as needed for dizziness (Patient not taking: Reported on 07/11/2016) 20 tablet 0  . valACYclovir (VALTREX) 500 MG tablet Take 1 tablet (500 mg total) by mouth daily. 90 tablet 3   No facility-administered medications prior to visit.     ROS Review of Systems  HENT: Positive for tinnitus.   Respiratory: Negative.   Cardiovascular: Negative.     Neurological: Positive for dizziness.    Objective:  BP (!) 143/82 (BP Location: Left Arm, Patient Position: Sitting, Cuff Size: Normal)   Pulse 64   Temp 97.8 F (36.6 C) (Oral)   Resp 18   Ht 5' (1.524 m)   Wt 171 lb (77.6 kg)   LMP 06/02/2016 (Exact Date)   SpO2 100%   BMI 33.40 kg/m   BP/Weight 07/11/2016 07/03/2016 06/21/2016  Systolic BP 143 121 163  Diastolic BP 82 74 82  Wt. (Lbs) 171 180 179.8  BMI 33.4 34.01 33.97    Physical Exam  Constitutional: She is oriented to person, place, and time.  Eyes: Pupils are equal, round, and reactive to light.  Cardiovascular: Normal rate, regular rhythm, normal heart sounds and intact distal pulses.   Pulmonary/Chest: Effort normal and breath sounds normal.  Neurological: She is alert and oriented to person, place, and time.    Assessment & Plan:   Problem List Items Addressed This Visit      Other   Dizziness - Primary   Relevant Medications   dimenhyDRINATE (DRAMAMINE) 50 MG tablet   Other Relevant Orders   Basic Metabolic Panel (Completed)   CBC with Differential (Completed)   Ambulatory referral to Audiology    Other Visit Diagnoses    Tinnitus of both ears       Relevant Medications   dimenhyDRINATE (DRAMAMINE) 50 MG tablet   Other Relevant Orders   Ambulatory referral to Audiology     Meds ordered this  encounter  Medications  . dimenhyDRINATE (DRAMAMINE) 50 MG tablet    Sig: Take 1 tablet (50 mg total) by mouth every 8 (eight) hours as needed for nausea or dizziness.    Dispense:  60 tablet    Refill:  0    Order Specific Question:   Supervising Provider    Answer:   Quentin Angst [id number]    Follow-up: Return if symptoms worsen or fail to improve.   Lizbeth Bark FNP

## 2016-07-12 LAB — BASIC METABOLIC PANEL
BUN: 11 mg/dL (ref 7–25)
CALCIUM: 9.5 mg/dL (ref 8.6–10.2)
CO2: 28 mmol/L (ref 20–31)
Chloride: 103 mmol/L (ref 98–110)
Creat: 0.51 mg/dL (ref 0.50–1.10)
Glucose, Bld: 80 mg/dL (ref 65–99)
Potassium: 4.3 mmol/L (ref 3.5–5.3)
SODIUM: 139 mmol/L (ref 135–146)

## 2016-07-14 ENCOUNTER — Telehealth: Payer: Self-pay

## 2016-07-14 NOTE — Telephone Encounter (Signed)
CMA CALL TO GO OVER THE RESULTS NO ONE ANSWER BUT LEFT THE RESULTS ON THE VM

## 2016-07-14 NOTE — Telephone Encounter (Signed)
-----   Message from Lizbeth BarkMandesia R Hairston, FNP sent at 07/14/2016  6:44 AM EST ----- -Labs that evaluated your electrolytes, fluid balance, blood cells, and kidney function were normal. -Follow up with audiology referral.

## 2016-09-01 ENCOUNTER — Encounter: Payer: Self-pay | Admitting: Family Medicine

## 2016-09-01 ENCOUNTER — Ambulatory Visit: Payer: Self-pay | Attending: Family Medicine | Admitting: Family Medicine

## 2016-09-01 VITALS — BP 134/85 | HR 64 | Temp 98.3°F | Resp 18 | Ht 60.0 in | Wt 171.4 lb

## 2016-09-01 DIAGNOSIS — R202 Paresthesia of skin: Secondary | ICD-10-CM

## 2016-09-01 DIAGNOSIS — F419 Anxiety disorder, unspecified: Secondary | ICD-10-CM

## 2016-09-01 DIAGNOSIS — F410 Panic disorder [episodic paroxysmal anxiety] without agoraphobia: Secondary | ICD-10-CM | POA: Insufficient documentation

## 2016-09-01 DIAGNOSIS — R200 Anesthesia of skin: Secondary | ICD-10-CM

## 2016-09-01 DIAGNOSIS — Z23 Encounter for immunization: Secondary | ICD-10-CM

## 2016-09-01 DIAGNOSIS — M542 Cervicalgia: Secondary | ICD-10-CM

## 2016-09-01 LAB — CBC WITH DIFFERENTIAL/PLATELET
BASOS PCT: 0 %
Basophils Absolute: 0 cells/uL (ref 0–200)
Eosinophils Absolute: 80 cells/uL (ref 15–500)
Eosinophils Relative: 1 %
HCT: 42.9 % (ref 35.0–45.0)
Hemoglobin: 14.5 g/dL (ref 11.7–15.5)
LYMPHS PCT: 38 %
Lymphs Abs: 3040 cells/uL (ref 850–3900)
MCH: 30.8 pg (ref 27.0–33.0)
MCHC: 33.8 g/dL (ref 32.0–36.0)
MCV: 91.1 fL (ref 80.0–100.0)
MONOS PCT: 8 %
MPV: 9.9 fL (ref 7.5–12.5)
Monocytes Absolute: 640 cells/uL (ref 200–950)
Neutro Abs: 4240 cells/uL (ref 1500–7800)
Neutrophils Relative %: 53 %
Platelets: 287 10*3/uL (ref 140–400)
RBC: 4.71 MIL/uL (ref 3.80–5.10)
RDW: 14 % (ref 11.0–15.0)
WBC: 8 10*3/uL (ref 3.8–10.8)

## 2016-09-01 LAB — BASIC METABOLIC PANEL
BUN: 12 mg/dL (ref 7–25)
CO2: 25 mmol/L (ref 20–31)
CREATININE: 0.53 mg/dL (ref 0.50–1.10)
Calcium: 9.8 mg/dL (ref 8.6–10.2)
Chloride: 104 mmol/L (ref 98–110)
Glucose, Bld: 91 mg/dL (ref 65–99)
Potassium: 4.4 mmol/L (ref 3.5–5.3)
Sodium: 139 mmol/L (ref 135–146)

## 2016-09-01 LAB — POCT GLYCOSYLATED HEMOGLOBIN (HGB A1C): HEMOGLOBIN A1C: 5.2

## 2016-09-01 LAB — VITAMIN B12: VITAMIN B 12: 493 pg/mL (ref 200–1100)

## 2016-09-01 MED ORDER — IBUPROFEN 800 MG PO TABS: 800.0000 mg | ORAL_TABLET | Freq: Three times a day (TID) | ORAL | 0 refills | Status: DC | PRN

## 2016-09-01 MED ORDER — ESCITALOPRAM OXALATE 10 MG PO TABS: 10.0000 mg | ORAL_TABLET | Freq: Every day | ORAL | 0 refills | Status: DC

## 2016-09-01 NOTE — Patient Instructions (Addendum)
Get financial paperwork to apply for orange card to complete referral process.  Escitalopram tablets Qu es este medicamento? El ESCITALOPRAM se [place] para el tratamiento de la depresin y ciertos tipos de ansiedad. Este medicamento puede ser utilizado para otros usos; si tiene alguna pregunta consulte con su proveedor de atencin mdica o con su farmacutico. MARCAS COMUNES: Lexapro Qu le debo informar a mi profesional de la salud antes de tomar este medicamento? Necesita saber si usted presenta alguno de los siguientes problemas o situaciones: -trastorno bipolar o antecedentes familiares del trastorno bipolar -diabetes -glaucoma -enfermedad cardiaca -enfermedad renal o heptica -recibe tratamiento electroconvulsivo -convulsiones -ideas suicidas, planes o si usted o alguien de su familia ha intentado un suicidio previo -una reaccin alrgica o inusual al escitalopram, al medicamento relacionado citalopram, a otros medicamentos, alimentos, colorantes o conservadores -si est embarazada o buscando quedar embarazada -si est amamantando a un beb Cmo debo utilizar este medicamento? Tome este medicamento por va oral con un vaso de agua. Siga las instrucciones de la etiqueta del [place]. Puede tomarlo con o sin alimentos. Si el Social worker, tmelo con alimentos. Tome su medicamento a intervalos regulares. No lo tome con una frecuencia mayor a la indicada. No deje de tomar [employer] de repente a menos que as lo indique su mdico. Dejar de Visual merchandiser medicamento demasiado rpido puede causar efectos secundarios graves o podra empeorar su afeccin. Su farmacutico le dar una [place] del medicamento especial (MedGuide, nombre en ingls) con cada receta y en cada ocasin que la vuelva a surtir. Asegrese de leer esta informacin cada vez cuidadosamente. Hable con su pediatra para informarse acerca del uso de este medicamento en nios. Puede requerir  atencin especial. Sobredosis: Pngase en contacto inmediatamente con un centro toxicolgico o una sala de urgencia si usted cree que haya tomado demasiado medicamento. ATENCIN: [employer] es solo para usted. No comparta este medicamento con nadie. Qu sucede si me olvido de una dosis? Si olvida una dosis, tmela lo antes posible. Si es casi la hora de la prxima dosis, tome slo esa dosis. No tome dosis adicionales o dobles. Qu puede interactuar con este medicamento? No tome este medicamento con ninguno de los siguientes frmacos: ciertos medicamentos para infecciones micticas, tales como fluconazol, itraconazol, ketoconazol, posaconazol y voriconazol cisaprida citalopram dofetilida dronedarona linezolida IMAO, tales como [place], Eldepryl, Marplan, [place] y Parnate azul de metileno (inyectado en una vena) pimozida tioridazina ziprasidona Este medicamento tambin puede interactuar con los siguientes medicamentos: alcohol anfetaminas aspirina y medicamentos tipo aspirina carbamazepina ciertos medicamentos para la depresin, ansiedad o trastornos psicticos ciertos medicamentos para la migraa, tales como almotriptn, eletriptn, frovatriptn, naratriptn, rizatriptn, sumatriptn y zolmitriptn ciertos medicamentos para conciliar el sueo ciertos medicamentos que tratan o previenen cogulos sanguneos, tales como warfarina, enoxaparina, dalteparina cimetidina diurticos fentanilo furazolidona isoniazida litio metoprolol AINE, medicamentos para el dolor y la inflamacin, como ibuprofeno o naproxeno otros medicamentos que prolongan el intervalo QT (causan un ritmo cardiaco anormal) procarbazina rasagilina suplementos tales como hierba de [place], kava kava y valeriana tramadol triptfano Puede ser que esta lista no menciona todas las posibles interacciones. Informe a su profesional de [employer] de [employer] productos a base de hierbas, medicamentos de [place] o suplementos nutritivos que est  tomando. Si usted fuma, consume bebidas alcohlicas o si utiliza drogas ilegales, indqueselo tambin a su profesional de [employer]. Algunas sustancias pueden interactuar con su medicamento. A qu debo estar atento al usar [employer]? Informe a su mdico  si sus sntomas no mejoran o si empeoran. Visite a su mdico o a su profesional de la salud para chequear su evolucin peridicamente. Debido que puede ser necesario tomar este medicamento durante varias semanas para que sea posible observar sus efectos en forma [place]ta, es importante que sigue su tratamiento como recetado por su mdico. Los pacientes y sus familias deben estar atentos si empeora la depresin o ideas suicidas. Tambin est atento a cambios repentinos o severos de emocin, tales como el sentirse ansioso, agitado, lleno de pnico, irritable, hostil, agresivo, impulsivo, inquietud severa, demasiado excitado y hiperactivo o dificultad para conciliar el sueo. Si esto ocurre, especialmente al comenzar con el tratamiento o al cambiar de dosis, comunquese con su profesional de Beazer Homesla salud. Puede experimentar somnolencia o mareos. No conduzca ni utilice maquinaria, ni haga nada que Scientist, research (life sciences)le exija permanecer en estado de alerta hasta que sepa cmo le afecta este medicamento. No se siente ni se ponga de pie con rapidez, especialmente si es un paciente de edad avanzada. Esto reduce el riesgo de mareos o Newell Rubbermaiddesmayos. El alcohol puede interferir con el efecto de [place] medicamento. Evite consumir bebidas alcohlicas. Se le podr secar la boca. Masticar chicle sin azcar, chupar caramelos duros y beber agua en abundancia le ayudar a mantener la boca hmeda. Si el problema no desaparece o es severo, consulte a su mdico. Qu efectos secundarios puedo tener al Boston Scientificutilizar este medicamento? Efectos secundarios que debe informar a su mdico o a Producer, television/film/videosu profesional de la salud tan pronto como sea posible: Therapist, artreacciones alrgicas, como erupcin cutnea, comezn/picazn o  urticarias, e hinchazn de la cara, los labios o la lengua ansiedad heces de color negro y aspecto alquitranado cambios en la visin confusin estado de nimo elevado, menor necesidad de dormir, pensamientos acelerados, conducta impulsiva dolor ocular ritmo cardiaco rpido, irregular sensacin de desmayos o aturdimiento, cadas sensacin de agitacin, enojo o irritabilidad alucinaciones, prdida del contacto con la realidad prdida de equilibrio o coordinacin prdida de memoria ereccin dolorosa o prolongada inquietud, caminar de un lado a otro, incapacidad para quedarse quieto convulsiones rigidez de los Exelon Corporationmsculos ideas suicidas u otros cambios en el estado de nimo dificultad para conciliar el sueo sangrado o moretones inusuales cansancio o debilidad inusual vmito Efectos secundarios que generalmente no requieren atencin mdica (infrmelos a su mdico o a Producer, television/film/videosu profesional de la salud si persisten o si son molestos): cambios en el apetito cambios en el deseo o desempeo sexual dolor de cabeza aumento de la sudoracin indigestin, nuseas temblores Puede ser que esta lista no menciona todos los posibles efectos secundarios. Comunquese a su mdico por asesoramiento mdico Hewlett-Packardsobre los efectos secundarios. Usted puede informar los efectos secundarios a la FDA por telfono al 1-800-FDA-1088. Dnde debo guardar mi medicina? Mantngala fuera del alcance de los nios. Gurdela a Sanmina-SCItemperatura ambiente, entre 15 y 30 grados C (2959 y 4886 grados F). Deseche todo el medicamento que no haya utilizado, despus de la fecha de vencimiento. ATENCIN: Este folleto es un resumen. Puede ser que no cubra toda la posible informacin. Si usted tiene preguntas acerca de esta medicina, consulte con su mdico, su farmacutico o su profesional de Radiographer, therapeuticla salud.  2018 Elsevier/Gold Standard (2016-07-21 00:00:00)   Trastorno de ansiedad generalizada (Generalized Anxiety Disorder) El trastorno de ansiedad generalizada es un trastorno  mental. Interfiere en las funciones vitales, incluyendo las Wallacerelaciones, el trabajo y la escuela.  Es diferente de la ansiedad normal que todas las personas experimentan en algn momento de su vida en respuesta a  sucesos y [employer]. En verdad, la ansiedad normal nos ayuda a prepararnos y Human resources officer acontecimientos y actividades de la vida. La ansiedad normal desaparece despus de que el evento o la actividad ha finalizado.  El trastorno de ansiedad generalizada no est necesariamente relacionada con eventos o actividades especficas. Tambin causa un exceso de ansiedad en proporcin a sucesos o actividades especficas. En este trastorno la ansiedad es difcil de Chief Operating Officer. Los sntomas pueden variar de leves a muy graves. Las personas que sufren de trastorno de ansiedad generalizada pueden tener intensas olas de ansiedad con sntomas fsicos (ataques de pnico).  SNTOMAS  La ansiedad y la preocupacin asociada a este trastorno son difciles de Chief Operating Officer. Esta ansiedad y la preocupacin estn relacionados con muchos eventos de la vida y sus actividades y tambin ocurre durante ms [employer] que no ocurre, durante 6 meses o ms. Las personas que la sufren pueden tener tres o ms de los siguientes sntomas (uno o ms en los nios):   Glass blower/designer.  Dificultades de concentracin.   Irritabilidad.  Tensin muscular  Dificultad para dormirse o sueo poco satisfactorio. DIAGNSTICO  Se diagnostica a travs de una evaluacin realizada por el mdico. El mdico le har preguntas acerca de su estado de nimo, sntomas fsicos y sucesos de [place] vida. Le har preguntas sobre su historia clnica, el consumo de alcohol o drogas, incluyendo los medicamentos recetados. N[employer] Corporation un examen fsico e indicar anlisis de [place]. Ciertas enfermedades y el uso de determinadas sustancias pueden causar sntomas similares a este trastorno. Su mdico lo puede derivar a Music therapist en  salud mental para una evaluacin ms profunda.Gerlean Ren  Las terapias siguientes se utilizan en el tratamiento de este trastorno:   Medicamentos - Se recetan antidepresivos para el control diario a Air cabin crew. Pueden indicarse tambin medicamentos para combatir la [employer] graves, especialmente cuando ocurren ataques de pnico.   Terapia conversada (psicoterapia) Ciertos tipos de psicoterapia pueden ser tiles en el tratamiento del trastorno de ansiedad generalizada, proporcionando apoyo, educacin y Optometrist. Una forma de psicoterapia llamada terapia cognitivo-conductual puede ensearle formas saludables de pensar y Publishing rights manager a los eventos y actividades de la vida diaria.  Tcnicasde manejo del estrs- Estas tcnicas incluyen el yoga, la meditacin y el ejercicio y pueden ser muy tiles cuando se practican con regularidad. Un especialista en salud mental puede ayudar a determinar qu tratamiento es mejor para usted. Algunas personas obtienen mejora con una terapia. Sin embargo, Economist requieren una combinacin de terapias.  Esta informacin no tiene Theme park manager el consejo del mdico. Asegrese de hacerle al mdico cualquier pregunta que tenga. Document Released: 10/15/2012 Document Revised: 07/11/2014 Elsevier Interactive Patient Education  2017 [employer].

## 2016-09-01 NOTE — Progress Notes (Signed)
Patient is here for establish care  Patient complains about neck pain it feels tired & heavy like if she is carry something in her neck  From the ear to the bottom it hurts when she turn  She takes ibuprofen ovc 200 mg for the pain  Patient also complains about back / leg pain also feel her feet goes numb & it feels hot  The symptoms been going on for 3 weeks

## 2016-09-01 NOTE — Progress Notes (Signed)
Subjective:  Patient ID: Lisa MarkerJuana [name], female    DOB: 20-Nov-1970  Age: 46 y.o. MRN: [id number]  CC: Establish Care   HPI Lisa [name] presents for   Neck pain: 3 weeks, no fevers, no history of neck injury. 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   Anxiety: History of panic attack in December. Denies any SI/HI. Reports sleeping 6 to 7 hours per night.  Paresthesias to both feet: Reports paresthesias feet that started 2 weeks ago. Denies any back or foot injury.     Outpatient Medications Prior to Visit  Medication Sig Dispense Refill  . cephALEXin (KEFLEX) 500 MG capsule Take 1 capsule (500 mg total) by mouth 4 (four) times daily. (Patient not taking: Reported on 07/11/2016) 40 capsule 0  . cyclobenzaprine (FLEXERIL) 5 MG tablet Take 1 tablet (5 mg total) by mouth 3 (three) times daily as needed for muscle spasms. (Patient not taking: Reported on 07/11/2016) 30 tablet 0  . dimenhyDRINATE (DRAMAMINE) 50 MG tablet Take 1 tablet (50 mg total) by mouth every 8 (eight) hours as needed for nausea or dizziness. 60 tablet 0  . hydrochlorothiazide 25 MG tablet Take 1 tablet (25 mg total) by mouth daily. 30 tablet 4  . HYDROcodone-acetaminophen (NORCO/VICODIN) 5-325 MG tablet Take 1 tablet by mouth every 4 (four) hours as needed. (Patient not taking: Reported on 07/11/2016) 15 tablet 0  . LORazepam (ATIVAN) 0.5 MG tablet Take 1 tablet (0.5 mg total) by mouth 3 (three) times daily as needed for anxiety. (Patient not taking: Reported on 07/11/2016) 5 tablet 0  . meclizine (ANTIVERT) 25 MG tablet Take one every 6 hours as needed for dizziness (Patient not taking: Reported on 07/11/2016) 20 tablet 0  . valACYclovir (VALTREX) 500 MG tablet Take 1 tablet (500 mg total) by mouth daily. 90 tablet 3  . ibuprofen (ADVIL,MOTRIN) 600 MG tablet Take 1 tablet (600 mg total) by mouth every 8 (eight) hours as needed. (Patient not taking: Reported on 07/11/2016) 30 tablet 0   No facility-administered medications prior to visit.      ROS Review of Systems  Respiratory: Negative.   Cardiovascular: Negative.   Gastrointestinal: Negative.   Musculoskeletal: Positive for back pain.  Neurological:       Tingling and numbness to bilateral feet.  Psychiatric/Behavioral: The patient is nervous/anxious.      Objective:  BP 134/85 (BP Location: Left Arm, Patient Position: Sitting, Cuff Size: Normal)   Pulse 64   Temp 98.3 F (36.8 C) (Oral)   Resp 18   Ht 5' (1.524 m)   Wt 171 lb 6.4 oz (77.7 kg)   SpO2 99%   BMI 33.47 kg/m   BP/Weight 09/01/2016 07/11/2016 07/03/2016  Systolic BP 134 143 121  Diastolic BP 85 82 74  Wt. (Lbs) 171.4 171 180  BMI 33.47 33.4 34.01     Physical Exam  Constitutional: She is oriented to person, place, and time.  Eyes: Conjunctivae are normal. Pupils are equal, round, and reactive to light.  Neck: No JVD present.  Cardiovascular: Normal rate, regular rhythm, normal heart sounds and intact distal pulses.   Pulmonary/Chest: Effort normal and breath sounds normal.  Abdominal: Soft. Bowel sounds are normal.  Musculoskeletal: Normal range of motion.       Cervical back: She exhibits pain.  Neurological: She is alert and oriented to person, place, and time. She has normal reflexes.  CNXI intact  Skin: Skin is warm and dry.  Psychiatric: She expresses no homicidal and no suicidal ideation.  She expresses no suicidal plans and no homicidal plans.  Nursing note and vitals reviewed.   Assessment & Plan:   Problem List Items Addressed This Visit    None    Visit Diagnoses    Musculoskeletal neck pain    -  Primary   Relevant Medications   ibuprofen (ADVIL,MOTRIN) 800 MG tablet   Numbness and tingling of both feet       Relevant Orders   HgB A1c (Completed)   Vitamin B12 (Completed)   VITAMIN D 25 Hydroxy (Vit-D Deficiency, Fractures) (Completed)   Basic Metabolic Panel (Completed)   CBC with Differential (Completed)   Anxiety       Relevant Medications   escitalopram  (LEXAPRO) 10 MG tablet   Needs flu shot       Relevant Orders   Flu Vaccine QUAD 36+ mos PF IM (Fluarix & Fluzone Quad PF) (Completed)      Meds ordered this encounter  Medications  . ibuprofen (ADVIL,MOTRIN) 800 MG tablet    Sig: Take 1 tablet (800 mg total) by mouth every 8 (eight) hours as needed for moderate pain or cramping.    Dispense:  40 tablet    Refill:  0    Order Specific Question:   Supervising Provider    Answer:   Quentin Angst [id number]  . escitalopram (LEXAPRO) 10 MG tablet    Sig: Take 1 tablet (10 mg total) by mouth daily. After one week may increase dose to 2 tablets (20 mg total) by mouth daily.    Dispense:  60 tablet    Refill:  0    Order Specific Question:   Supervising Provider    Answer:   Quentin Angst [[id number]]    Follow-up: Return in about 6 weeks (around 10/13/2016) for Anxiety and Medication Follow Up .   Lizbeth Bark FNP

## 2016-09-02 LAB — VITAMIN D 25 HYDROXY (VIT D DEFICIENCY, FRACTURES): Vit D, 25-Hydroxy: 22 ng/mL — ABNORMAL LOW (ref 30–100)

## 2016-09-05 ENCOUNTER — Other Ambulatory Visit: Payer: Self-pay | Admitting: Family Medicine

## 2016-09-05 ENCOUNTER — Telehealth: Payer: Self-pay

## 2016-09-05 DIAGNOSIS — E559 Vitamin D deficiency, unspecified: Secondary | ICD-10-CM

## 2016-09-05 MED ORDER — CALCIUM 600-400 MG-UNIT PO CHEW: 1.0000 | CHEWABLE_TABLET | Freq: Every day | ORAL | 2 refills | Status: DC

## 2016-09-05 NOTE — Telephone Encounter (Signed)
CMA call to go over lab results  Patient did  Not answer but left a VM stating the results & if have any questions just to call back

## 2016-09-05 NOTE — Telephone Encounter (Signed)
-----   Message from Lizbeth BarkMandesia R Hairston, FNP sent at 09/05/2016  2:22 PM EST ----- Vitamin D level was low. Vitamin D helps to keep bones strong. You were prescribed a vitamin-d supplement to increase your levels. Labs normal. Vitamin B12 is normal. When vitamin B12 is low it can cause neurological problems.

## 2016-10-13 ENCOUNTER — Other Ambulatory Visit: Payer: Self-pay

## 2016-10-13 ENCOUNTER — Ambulatory Visit: Payer: Self-pay | Attending: Family Medicine | Admitting: Family Medicine

## 2016-10-13 VITALS — BP 139/83 | HR 60 | Temp 98.0°F | Resp 18 | Ht 60.0 in | Wt 173.2 lb

## 2016-10-13 DIAGNOSIS — R002 Palpitations: Secondary | ICD-10-CM | POA: Insufficient documentation

## 2016-10-13 DIAGNOSIS — F419 Anxiety disorder, unspecified: Secondary | ICD-10-CM | POA: Insufficient documentation

## 2016-10-13 DIAGNOSIS — Z0000 Encounter for general adult medical examination without abnormal findings: Secondary | ICD-10-CM

## 2016-10-13 DIAGNOSIS — R42 Dizziness and giddiness: Secondary | ICD-10-CM | POA: Insufficient documentation

## 2016-10-13 DIAGNOSIS — Z87898 Personal history of other specified conditions: Secondary | ICD-10-CM

## 2016-10-13 DIAGNOSIS — H6122 Impacted cerumen, left ear: Secondary | ICD-10-CM | POA: Insufficient documentation

## 2016-10-13 DIAGNOSIS — Z79899 Other long term (current) drug therapy: Secondary | ICD-10-CM | POA: Insufficient documentation

## 2016-10-13 NOTE — Patient Instructions (Addendum)
Apply for orange card to complete referral process. You will be called with your labs results.   Mareos (Dizziness) Los mareos son un problema muy frecuente. Causan sensacin de inestabilidad o de desvanecimiento. Puede sentir que se va a desmayar. Un mareo puede provocarle una lesin si se tropieza o se cae. Cualquier persona puede marearse, pero los Roy son ms frecuentes en los [employer]. Esta afeccin puede tener muchas causas, por ejemplo:  Medicamentos.  Deshidratacin.  Enfermedad. CUIDADOS EN EL HOGAR Estas indicaciones pueden ayudarlo con el trastorno: Comida y bebida  Beba suficiente lquido para Pharmacologist el pis (orina) claro o de color amarillo plido. Esto evita la deshidratacin. Trate de beber ms lquidos transparentes, como agua.  No beba alcohol.  Limite la cantidad de cafena que bebe o come si el mdico se lo indic.  Limite la cantidad de sal que bebe o come si el mdico se lo indic. Actividad  Evite los movimientos rpidos.  Cuando se levante de una silla, sujtese hasta sentirse bien.  Por la maana, sintese primero a un lado de la cama. Cuando se sienta bien, pngase lentamente de [address] se sostiene de algo, hasta que sepa que ha logrado el equilibrio.  Mueva las piernas con frecuencia si debe estar de pie en un lugar durante mucho tiempo. Mientras est de pie, contraiga y relaje los msculos de las piernas.  No conduzca vehculos ni utilice maquinarias pesadas si se siente mareado.  Evite agacharse si se siente mareado. En su casa, coloque los objetos de modo que le resulte fcil alcanzarlos sin Public librarian. Estilo de vida  No consuma ningn producto que contenga tabaco, lo que incluye cigarrillos, tabaco de Theatre manager o Administrator, Civil Service. Si necesita ayuda para dejar de fumar, consulte al [employer].  Trate de reducir el nivel de estrs practicando actividades como el yoga o la meditacin. Hable con el mdico si necesita  ayuda. Instrucciones generales  Controle sus mareos para ver si hay cambios.  Tome los medicamentos solamente como se lo haya indicado el mdico. Hable con el mdico si cree que algn medicamento que est tomando es la causa de sus [place].  Infrmele a un amigo o a un familiar si se siente mareado. Pdale a esta persona que llame al mdico si observa cambios en su comportamiento.  Concurra a todas las visitas de control como se lo haya indicado el mdico. Esto es importante. SOLICITE AYUDA SI:  Los [employer].  Los [employer] o la sensacin de Production assistant, radio.  Siente malestar estomacal (nuseas).  Tiene problemas para escuchar.  Aparecen nuevos sntomas.  Cuando est de pie se siente inestable o que la habitacin da vueltas. SOLICITE AYUDA DE INMEDIATO SI:  Vomita o tiene diarrea y no puede comer ni beber nada.  Tiene dificultad para lo siguiente:  Hablar.  Caminar.  Tragar.  Usar los brazos, las manos o las piernas.  Siente una debilidad generalizada.  No piensa con claridad o tiene dificultades para armar oraciones. Es posible que un amigo o un familiar adviertan que esto ocurre.  Tiene los siguientes sntomas:  Journalist, newspaper.  Dolor en el vientre (abdomen).  Falta de aire.  Sudoracin.  Cambios en la visin.  Hemorragias.  Dolores de [place].  Dolor o rigidez en el cuello.  Grant Ruts. Esta informacin no tiene Theme park manager el consejo del mdico. Asegrese de hacerle al mdico cualquier pregunta que tenga. Document Released: 06/09/2011 Document Revised: 11/04/2014 Document Reviewed: 06/16/2014 Elsevier Interactive Patient Education  2017 Elsevier  Inc.  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 Interfiere en las funciones vitales, incluyendo las [place], el trabajo y la escuela.  Es diferente de la ansiedad normal que todas las personas  experimentan en algn momento de su vida en respuesta a sucesos y Chief Operating Officer. En verdad, la ansiedad normal nos ayuda a prepararnos y Human resources officer acontecimientos y actividades de la vida. La ansiedad normal desaparece despus de que el evento o la actividad ha finalizado.  El trastorno de ansiedad generalizada no est necesariamente relacionada con eventos o actividades especficas. Tambin causa un exceso de ansiedad en proporcin a sucesos o actividades especficas. En este trastorno la ansiedad es difcil de Chief Operating Officer. Los sntomas pueden variar de leves a muy graves. Las personas que sufren de trastorno de ansiedad generalizada pueden tener intensas olas de ansiedad con sntomas fsicos (ataques de pnico).  SNTOMAS  La ansiedad y la preocupacin asociada a este trastorno son difciles de Chief Operating Officer. Esta ansiedad y la preocupacin estn relacionados con muchos eventos de la vida y sus actividades y tambin ocurre durante ms [employer] que no ocurre, durante 6 meses o ms. Las personas que la sufren pueden tener tres o ms de los siguientes sntomas (uno o ms en los nios):   Glass blower/designer.  Dificultades de concentracin.   Irritabilidad.  Tensin muscular  Dificultad para dormirse o sueo poco satisfactorio. DIAGNSTICO  Se diagnostica a travs de una evaluacin realizada por el mdico. El mdico le har preguntas acerca de su estado de nimo, sntomas fsicos y sucesos de [place] vida. Le har preguntas sobre su historia clnica, el consumo de alcohol o drogas, incluyendo los medicamentos recetados. [employer] Corporation un examen fsico e indicar anlisis de [place]. Ciertas enfermedades y el uso de determinadas sustancias pueden causar sntomas similares a este trastorno. Su mdico lo puede derivar a Music therapist en salud mental para una evaluacin ms profunda.Gerlean Ren  Las terapias siguientes se utilizan en el tratamiento de este trastorno:   Medicamentos - Se  recetan antidepresivos para el control diario a Air cabin crew. Pueden indicarse tambin medicamentos para combatir la [employer] graves, especialmente cuando ocurren ataques de pnico.   Terapia conversada (psicoterapia) Ciertos tipos de psicoterapia pueden ser tiles en el tratamiento del trastorno de ansiedad generalizada, proporcionando apoyo, educacin y Optometrist. Una forma de psicoterapia llamada terapia cognitivo-conductual puede ensearle formas saludables de pensar y Publishing rights manager a los eventos y actividades de la vida diaria.  Tcnicasde manejo del estrs- Estas tcnicas incluyen el yoga, la meditacin y el ejercicio y pueden ser muy tiles cuando se practican con regularidad. Un especialista en salud mental puede ayudar a determinar qu tratamiento es mejor para usted. Algunas personas obtienen mejora con una terapia. Sin embargo, Economist requieren una combinacin de terapias.  Esta informacin no tiene Theme park manager el consejo del mdico. Asegrese de hacerle al mdico cualquier pregunta que tenga. Document Released: 10/15/2012 Document Revised: 07/11/2014 Elsevier Interactive Patient Education  2017 [employer].

## 2016-10-13 NOTE — Progress Notes (Signed)
Patient is here for f/up Anxiety & medication  Patient is experiencing palpations   Patient stopped her anxiety medication  Patient has eaten for today

## 2016-10-13 NOTE — Progress Notes (Signed)
Subjective:  Patient ID: Lisa [name], female    DOB: [birth year]  Age: 46 y.o. MRN: [id number]  CC: Establish Care   HPI Lisa [name] presents for   Interpreter services used: Burna Mortimer [id number]   Anxiety f/u: Took lexapro twice before discontinuing. Reports last time she took Lexapro was 2 week ago. Counseling resources provided last visit she reports not following up with counseling resources. Denies any SI/HI.  Tinnitus: Reports last episode was 2 weeks ago. Intermittent tinnitus with buzzing & ringing and decreased hearing in left ear since December. Denies any ear pain, history or ear injury, or ear drainage. Reports taking Dramamine only once.  Palpations: Reports symptoms since December. Occurs with working or exercise. Denies any personal history of cardiac problems or family history of heart disease.     Outpatient Medications Prior to Visit  Medication Sig Dispense Refill  . Calcium 600-400 MG-UNIT CHEW Chew 1 tablet by mouth daily. 60 tablet 2  . cephALEXin (KEFLEX) 500 MG capsule Take 1 capsule (500 mg total) by mouth 4 (four) times daily. (Patient not taking: Reported on 07/11/2016) 40 capsule 0  . cyclobenzaprine (FLEXERIL) 5 MG tablet Take 1 tablet (5 mg total) by mouth 3 (three) times daily as needed for muscle spasms. (Patient not taking: Reported on 07/11/2016) 30 tablet 0  . hydrochlorothiazide 25 MG tablet Take 1 tablet (25 mg total) by mouth daily. 30 tablet 4  . HYDROcodone-acetaminophen (NORCO/VICODIN) 5-325 MG tablet Take 1 tablet by mouth every 4 (four) hours as needed. (Patient not taking: Reported on 07/11/2016) 15 tablet 0  . ibuprofen (ADVIL,MOTRIN) 800 MG tablet Take 1 tablet (800 mg total) by mouth every 8 (eight) hours as needed for moderate pain or cramping. 40 tablet 0  . LORazepam (ATIVAN) 0.5 MG tablet Take 1 tablet (0.5 mg total) by mouth 3 (three) times daily as needed for anxiety. (Patient not taking: Reported on 07/11/2016) 5 tablet 0  . meclizine (ANTIVERT) 25  MG tablet Take one every 6 hours as needed for dizziness (Patient not taking: Reported on 07/11/2016) 20 tablet 0  . valACYclovir (VALTREX) 500 MG tablet Take 1 tablet (500 mg total) by mouth daily. 90 tablet 3  . dimenhyDRINATE (DRAMAMINE) 50 MG tablet Take 1 tablet (50 mg total) by mouth every 8 (eight) hours as needed for nausea or dizziness. 60 tablet 0  . escitalopram (LEXAPRO) 10 MG tablet Take 1 tablet (10 mg total) by mouth daily. After one week may increase dose to 2 tablets (20 mg total) by mouth daily. 60 tablet 0   No facility-administered medications prior to visit.     ROS Review of Systems  Constitutional: Negative.   HENT: Negative.   Eyes: Negative.   Respiratory: Negative.   Cardiovascular: Negative.   Gastrointestinal: Negative.   Neurological: Positive for dizziness.  Psychiatric/Behavioral: The patient is nervous/anxious.         Objective:  BP 139/83 (BP Location: Left Arm, Patient Position: Sitting, Cuff Size: Normal)   Pulse 60   Temp 98 F (36.7 C) (Oral)   Resp 18   Ht 5' (1.524 m)   Wt 173 lb 3.2 oz (78.6 kg)   SpO2 100%   BMI 33.83 kg/m   BP/Weight 10/13/2016 09/01/2016 07/11/2016  Systolic BP 139 134 143  Diastolic BP 83 85 82  Wt. (Lbs) 173.2 171.4 171  BMI 33.83 33.47 33.4     Physical Exam  HENT:  Head: Normocephalic.  Right Ear: External ear normal.  Nose: Nose  normal.  Mouth/Throat: Oropharynx is clear and moist.  Left ear: Small amount of harden cerumen in inner ear canal. Not obstructing view of TM. TM is WNL.  Eyes: Conjunctivae and EOM are normal. Pupils are equal, round, and reactive to light.  Neck: Normal range of motion. Neck supple. No JVD present.  Cardiovascular: Normal rate, regular rhythm, normal heart sounds and intact distal pulses.   Pulmonary/Chest: Effort normal and breath sounds normal.  Abdominal: Soft. Bowel sounds are normal.  Lymphadenopathy:    She has no cervical adenopathy.  Skin: Skin is warm and dry.    Nursing note and vitals reviewed.   Assessment & Plan:   Problem List Items Addressed This Visit    None    Visit Diagnoses    Vertigo    -  Primary      Relevant Orders   Ambulatory referral to Audiology   History of palpitations       -EKG normal. Suspect anxiety as cause of symptoms.   Will obtain TSH to rule out thyroid problem as a    cause.   Relevant Orders   TSH (Completed)   EKG 12-Lead   Cerumen debris on tympanic membrane, left       -Ear irrigation of left ear attempted in office but was discontinue due to patient's complaints of increased    dizziness.    Anxiety       -Pt. Declines medication at this time, agreeable to receiving counseling resources.    Relevant Orders   TSH (Completed)   Ambulatory referral to Psychology   Healthcare maintenance       Relevant Orders   HIV antibody (with reflex) (Completed)      No orders of the defined types were placed in this encounter.   Follow-up: Return in about 8 weeks (around 12/08/2016) for Anxiety.   Lizbeth Bark FNP

## 2016-10-14 LAB — HIV ANTIBODY (ROUTINE TESTING W REFLEX): HIV Screen 4th Generation wRfx: NONREACTIVE

## 2016-10-14 LAB — TSH: TSH: 1.11 u[IU]/mL (ref 0.450–4.500)

## 2016-10-17 ENCOUNTER — Telehealth: Payer: Self-pay

## 2016-10-17 NOTE — Telephone Encounter (Signed)
CMA call patient about lab results  Patient did not answer but left a detailed message & stated that if have any questions just to call back

## 2016-10-17 NOTE — Telephone Encounter (Signed)
-----   Message from Lizbeth Bark, FNP sent at 10/14/2016  6:07 AM EDT ----- Thyroid function normal HIV is negative.

## 2016-10-19 ENCOUNTER — Ambulatory Visit (HOSPITAL_COMMUNITY)
Admission: EM | Admit: 2016-10-19 | Discharge: 2016-10-19 | Disposition: A | Discharge status: Home / Self Care | Payer: Self-pay | Attending: Family Medicine | Admitting: Family Medicine

## 2016-10-19 ENCOUNTER — Encounter (HOSPITAL_COMMUNITY): Payer: Self-pay | Admitting: Emergency Medicine

## 2016-10-19 DIAGNOSIS — F411 Generalized anxiety disorder: Secondary | ICD-10-CM

## 2016-10-19 DIAGNOSIS — R531 Weakness: Secondary | ICD-10-CM

## 2016-10-19 LAB — POCT I-STAT, CHEM 8
BUN: 9 mg/dL (ref 6–20)
CALCIUM ION: 1.21 mmol/L (ref 1.15–1.40)
CHLORIDE: 104 mmol/L (ref 101–111)
Creatinine, Ser: 0.5 mg/dL (ref 0.44–1.00)
GLUCOSE: 120 mg/dL — AB (ref 65–99)
HCT: 44 % (ref 36.0–46.0)
Hemoglobin: 15 g/dL (ref 12.0–15.0)
Potassium: 4.9 mmol/L (ref 3.5–5.1)
SODIUM: 139 mmol/L (ref 135–145)
TCO2: 29 mmol/L (ref 0–100)

## 2016-10-19 MED ORDER — CITALOPRAM HYDROBROMIDE 20 MG PO TABS: 20.0000 mg | ORAL_TABLET | Freq: Every day | ORAL | 5 refills | Status: DC

## 2016-10-19 NOTE — ED Provider Notes (Signed)
[facility]    CSN: [id number] Arrival date & time: 10/19/16  1304     History   Chief Complaint Chief Complaint  Patient presents with  . Weakness    HPI Lisa [name] is a 46 y.o. female.   The patient presented to the [facility] with a complaint of weakness x 3 months.  She had a panic attack in December and hasn't felt well since. 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 She has chronic anxiety, worrying about her family.  Patient has heavy periods, the most recent of which just ended after 1 week of heavy bleeding.  Patient denies fever, cough, sore throat, shortness of breath, chest pain, abdominal pain, or thyroid problems      Past Medical History:  Diagnosis Date  . Hypertension   . Migraine     Patient Active Problem List   Diagnosis Date Noted  . Menorrhagia with regular cycle 03/13/2014  . Back pain 02/28/2014  . Dizziness 02/28/2014  . Muscle strain 02/20/2014  . 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 Depressive disorder 02/01/2011  . Fatigue 11/22/2010  . OBESITY, NOS 08/31/2006  . MIGRAINE, UNSPEC., W/O INTRACTABLE MIGRAINE 08/31/2006  . HYPERTENSION, BENIGN SYSTEMIC 08/31/2006    History reviewed. No pertinent surgical history.  OB History    No data available       Home Medications    Prior to Admission medications   Medication Sig Start Date End Date Taking? Authorizing Provider  citalopram (CELEXA) 20 MG tablet Take 1 tablet (20 mg total) by mouth daily. 10/19/16   Elvina Sidle, MD  valACYclovir (VALTREX) 500 MG tablet Take 1 tablet (500 mg total) by mouth daily. 01/31/11 01/31/12  Durwin Reges, MD    Family History Family History  Problem Relation Age of Onset  . Diabetes Mother   . Hypertension Mother     Social History Social History  Substance Use Topics  . Smoking status: Never Smoker  . Smokeless tobacco: Never Used  . Alcohol use No     Allergies   Patient has no  known allergies.   Review of Systems Review of Systems  Constitutional: Positive for fatigue.  HENT: Negative.   Respiratory: Negative.   Cardiovascular: Negative.   Gastrointestinal: Negative.   Genitourinary: Positive for menstrual problem. Negative for difficulty urinating and dysuria.  Neurological: Negative.   Psychiatric/Behavioral: The patient is nervous/anxious.      Physical Exam Triage Vital Signs ED Triage Vitals  Enc Vitals Group     BP 10/19/16 1344 (!) 147/77     Pulse Rate 10/19/16 1344 69     Resp 10/19/16 1344 18     Temp 10/19/16 1344 98.3 F (36.8 C)     Temp Source 10/19/16 1344 Oral     SpO2 10/19/16 1344 99 %     Weight --      Height --      Head Circumference --      Peak Flow --      Pain Score 10/19/16 1343 0     Pain Loc --      Pain Edu? --      Excl. in GC? --    No data found.   Updated Vital Signs BP (!) 147/77 (BP Location: Right Arm)   Pulse 69   Temp 98.3 F (36.8 C) (Oral)   Resp 18   SpO2 99%  Physical Exam  Constitutional: She is oriented to person, place, and time. She appears well-developed and well-nourished.  HENT:  Right Ear: External ear normal.  Left Ear: External ear normal.  Mouth/Throat: Oropharynx is clear and moist.  Eyes: Conjunctivae and EOM are normal. Pupils are equal, round, and reactive to light.  Neck: Normal range of motion. Neck supple.  Cardiovascular: Normal rate, regular rhythm and normal heart sounds.   Pulmonary/Chest: Effort normal and breath sounds normal.  Abdominal: Soft. There is no tenderness.  Musculoskeletal: Normal range of motion.  Neurological: She is alert and oriented to person, place, and time.  Skin: Skin is warm and dry.  Psychiatric:  Patient appears depressed with poor eye contact, downward gaze, and trembling at times when she describes how weak she feels.  Nursing note and vitals reviewed.    UC Treatments / Results  Labs (all labs ordered are listed, but only  abnormal results are displayed) Labs Reviewed  POCT I-STAT, CHEM 8 - Abnormal; Notable for the following:       Result Value   Glucose, Bld 120 (*)    All other components within normal limits    EKG  EKG Interpretation None       Radiology No results found.  Procedures Procedures (including critical care time)  Medications Ordered in UC Medications - No data to display   Initial Impression / Assessment and Plan / UC Course  I have reviewed the triage vital signs and the nursing notes.  Pertinent labs & imaging results that were available during my care of the patient were reviewed by me and considered in my medical decision making (see chart for details).     Final Clinical Impressions(s) / UC Diagnoses   Final diagnoses:  Generalized weakness  Generalized anxiety condition  New Prescriptions New Prescriptions   CITALOPRAM (CELEXA) 20 MG TABLET    Take 1 tablet (20 mg total) by mouth daily.     Elvina Sidle, MD 10/19/16 [phone]

## 2016-10-19 NOTE — ED Triage Notes (Signed)
The patient presented to the [facility] with a complaint of weakness x 2 weeks.

## 2016-10-19 NOTE — Discharge Instructions (Signed)
Tiene que hacer una cita con la doctor[facility] en [place].  Tome el remedio cada noche antes de dormir.  No trabaja hasta el lunes.

## 2016-12-13 ENCOUNTER — Ambulatory Visit: Payer: Self-pay | Admitting: Family Medicine

## 2016-12-28 ENCOUNTER — Ambulatory Visit: Payer: Self-pay | Admitting: Licensed Clinical Social Worker

## 2016-12-28 ENCOUNTER — Ambulatory Visit: Payer: Self-pay | Attending: Family Medicine | Admitting: Family Medicine

## 2016-12-28 VITALS — BP 149/81 | HR 59 | Temp 98.3°F | Resp 18 | Ht 60.0 in | Wt 172.4 lb

## 2016-12-28 DIAGNOSIS — F32A Depression, unspecified: Secondary | ICD-10-CM

## 2016-12-28 DIAGNOSIS — F329 Major depressive disorder, single episode, unspecified: Secondary | ICD-10-CM

## 2016-12-28 DIAGNOSIS — F410 Panic disorder [episodic paroxysmal anxiety] without agoraphobia: Secondary | ICD-10-CM | POA: Insufficient documentation

## 2016-12-28 DIAGNOSIS — Z8659 Personal history of other mental and behavioral disorders: Secondary | ICD-10-CM

## 2016-12-28 DIAGNOSIS — F418 Other specified anxiety disorders: Secondary | ICD-10-CM | POA: Insufficient documentation

## 2016-12-28 DIAGNOSIS — Z79899 Other long term (current) drug therapy: Secondary | ICD-10-CM | POA: Insufficient documentation

## 2016-12-28 DIAGNOSIS — I10 Essential (primary) hypertension: Secondary | ICD-10-CM | POA: Insufficient documentation

## 2016-12-28 LAB — POCT UA - MICROALBUMIN
Albumin/Creatinine Ratio, Urine, POC: <30
CREATININE, POC: 50 mg/dL
Microalbumin Ur, POC: 10 mg/L

## 2016-12-28 MED ORDER — LOSARTAN POTASSIUM 25 MG PO TABS: 25.0000 mg | ORAL_TABLET | Freq: Every day | ORAL | 2 refills | Status: DC

## 2016-12-28 NOTE — Patient Instructions (Signed)
Hipertensin Hypertension La hipertensin, conocida comnmente como presin arterial alta, se produce cuando la sangre bombea en las arterias con mucha fuerza. Las arterias son los vasos sanguneos que transportan la sangre desde el corazn al resto del cuerpo. La hipertensin hace que el corazn haga ms esfuerzo para bombear sangre y puede provocar que las arterias se estrechen o endurezcan. La hipertensin no tratada o no controlada puede causar infarto de miocardio, accidentes cerebrovasculares, enfermedad renal y otros problemas. Una lectura de la presin arterial consiste de un nmero ms alto sobre un nmero ms bajo. En condiciones ideales, la presin arterial debe estar por debajo de 120/80. El primer nmero ("superior") es la presin sistlica. Es la medida de la presin de las arterias cuando el corazn late. El segundo nmero ("inferior") es la presin diastlica. Es la medida de la presin en las arterias cuando el corazn se relaja. Cules son las causas? Se desconoce la causa de esta afeccin. Qu incrementa el riesgo? Algunos factores de riesgo de hipertensin estn bajo su control. Otros no. Factores que puede modificar  Fumar.  Tener diabetes mellitus tipo 2, colesterol alto, o ambos.  No hacer la cantidad suficiente de actividad fsica o ejercicio.  Tener sobrepeso.  Consumir mucha grasa, azcar, caloras o sal (sodio) en su dieta.  Beber alcohol en exceso. Factores que son difciles o imposibles de modificar  Tener enfermedad renal crnica.  Tener antecedentes familiares de presin arterial alta.  La edad. Los riesgos aumentan con la edad.  La raza. El riesgo es mayor para las personas afroamericanas.  El sexo. Antes de los 45aos, los hombres corren ms riesgo que las mujeres. Despus de los 65aos, las mujeres corren ms riesgo que los hombres.  Tener apnea obstructiva del sueo.  El estrs. Cules son los signos o los sntomas? La presin arterial  extremadamente alta (crisis hipertensiva) puede provocar:  Dolor de cabeza.  Ansiedad.  Falta de aire.  Hemorragia nasal.  Nuseas y vmitos.  Dolor de pecho intenso.  Una crisis de movimientos que no puede controlar (convulsiones).  Cmo se diagnostica? Esta afeccin se diagnostica midiendo su presin arterial mientras se encuentra sentado, con el brazo apoyado sobre una superficie. El brazalete del tensimetro debe colocarse directamente sobre la piel de la parte superior del brazo y al nivel de su corazn. Debe medirla al menos dos veces en el mismo brazo. Determinadas condiciones pueden causar una diferencia de presin arterial entre el brazo izquierdo y el derecho. Ciertos factores pueden provocar que las lecturas de la presin arterial sean inferiores o superiores a lo normal (elevadas) por un perodo corto de tiempo:  Si su presin arterial es ms alta cuando se encuentra en el consultorio del mdico que cuando la mide en su hogar, se denomina "hipertensin de bata blanca". La mayora de las personas que tienen esta afeccin no deben ser medicadas.  Si su presin arterial es ms alta en el hogar que cuando se encuentra en el consultorio del mdico, se denomina "hipertensin enmascarada". La mayora de las personas que tienen esta afeccin deben ser medicadas para controlar la presin arterial.  Si tiene una lecturas de presin arterial alta durante una visita o si tiene presin arterial normal con otros factores de riesgo:  Es posible que se le pida que regrese otro da para volver a controlar su presin arterial.  Se le puede pedir que se controle la presin arterial en su casa durante 1 semana o ms.  Si se le diagnostica hipertensin, es posible que   se le realicen otros anlisis de sangre o estudios de diagnstico por imgenes para ayudar a su mdico a comprender su riesgo general de tener otras afecciones. Cmo se trata? Esta afeccin se trata haciendo cambios saludables  en el estilo de vida, tales como ingerir alimentos saludables, realizar ms ejercicio y reducir el consumo de alcohol. El mdico puede recetarle medicamentos si los cambios en el estilo de vida no son suficientes para lograr controlar la presin arterial y si:  Su presin arterial sistlica est por encima de 130.  Su presin arterial diastlica est por encima de 80.  La presin arterial deseada puede variar en funcin de las enfermedades, la edad y otros factores personales. Siga estas instrucciones en su casa: Comida y bebida  Siga una dieta con alto contenido de fibras y potasio, y con bajo contenido de sodio, azcar agregada y grasas. Un ejemplo de plan alimenticio es la dieta DASH (Dietary Approaches to Stop Hypertension, Mtodos alimenticios para detener la hipertensin). Para alimentarse de esta manera: ? Coma mucha fruta y verdura fresca. Trate de que la mitad del plato de cada comida sea de frutas y verduras. ? Coma cereales integrales, como pasta integral, arroz integral y pan integral. Llene aproximadamente un cuarto del plato con cereales integrales. ? Coma y beba productos lcteos con bajo contenido de grasa, como leche descremada o yogur bajo en grasas. ? Evite la ingesta de cortes de carne grasa, carne procesada o curada, y carne de ave con piel. Llene aproximadamente un cuarto del plato con protenas magras, como pescado, pollo sin piel, frijoles, huevos y tofu. ? Evite ingerir alimentos prehechos o procesados. En general, estos tienen mayor cantidad de sodio, azcar agregada y grasa.  Reduzca su ingesta diaria de sodio. La mayora de las personas que tienen hipertensin deben comer menos de 1500 mg de sodio por da.  Limite el consumo de alcohol a no ms de 1 medida por da si es mujer y no est embarazada y a 2 medidas por da si es hombre. Una medida equivale a 12onzas de cerveza, 5onzas de vino o 1onzas de bebidas alcohlicas de alta graduacin. Estilo de vida  Trabaje  con su mdico para mantener un peso saludable o perder peso. Pregntele cual es su peso recomendado.  Realice al menos 30 minutos de ejercicio que haga que se acelere su corazn (ejercicio aerbico) la mayora de los das de la semana. Estas actividades pueden incluir caminar, nadar o andar en bicicleta.  Incluya ejercicios para fortalecer sus msculos (ejercicios de resistencia), como pilates o levantamiento de pesas, como parte de su rutina semanal de ejercicios. Intente realizar 30minutos de este tipo de ejercicios al menos tres das a la semana.  No consuma ningn producto que contenga nicotina o tabaco, como cigarrillos y cigarrillos electrnicos. Si necesita ayuda para dejar de fumar, consulte al mdico.  Contrlese la presin arterial en su casa segn las indicaciones del mdico.  Concurra a todas las visitas de control como se lo haya indicado el mdico. Esto es importante. Medicamentos  Tome los medicamentos de venta libre y los recetados solamente como se lo haya indicado el mdico. Siga cuidadosamente las indicaciones. Los medicamentos para la presin arterial deben tomarse segn las indicaciones.  No omita las dosis de medicamentos para la presin arterial. Si lo hace, estar en riesgo de tener problemas y puede hacer que los medicamentos sean menos eficaces.  Pregntele a su mdico a qu efectos secundarios o reacciones a los medicamentos debe prestar atencin. Comunquese con   un mdico si:  Piensa que tiene una reaccin a un medicamento que est tomando.  Tiene dolores de cabeza frecuentes (recurrentes).  Siente mareos.  Tiene hinchazn en los tobillos.  Tiene problemas de visin. Solicite ayuda de inmediato si:  Siente un dolor de cabeza intenso o confusin.  Siente debilidad inusual o adormecimiento.  Siente que va a desmayarse.  Siente un dolor intenso en el pecho o el abdomen.  Vomita repetidas veces.  Tiene dificultad para respirar. Resumen  La  hipertensin se produce cuando la sangre bombea en las arterias con mucha fuerza. Si esta afeccin no se controla, podra correr riesgo de tener complicaciones graves.  La presin arterial deseada puede variar en funcin de las enfermedades, la edad y otros factores personales. Para la Franklin Resourcesmayora de las personas, una presin arterial normal es menor que 120/80.  La hipertensin se trata con cambios en el estilo de vida, medicamentos o una combinacin de Roseambos. Los Danaher Corporationcambios en el estilo de vida incluyen prdida de peso, ingerir alimentos sanos, seguir una dieta baja en sodio, hacer ms ejercicio y Glass blower/designerlimitar el consumo de alcohol. Esta informacin no tiene Theme park managercomo fin reemplazar el consejo del mdico. Asegrese de hacerle al mdico cualquier pregunta que tenga. Document Released: 06/20/2005 Document Revised: 06/01/2016 Document Reviewed: 06/01/2016 Elsevier Interactive Patient Education  2018 [employer] Losartan tablets Qu es este medicamento? El LOSARTN se [place]utiliza para tratar la alta presin sangunea y en ciertos pacientes reduce el riesgo de padecer derrame cerebral. Este medicamento tambin disminuye la evolucin de la enfermedad renal en pacientes diabticos. Este medicamento puede ser utilizado para otros usos; si tiene alguna pregunta consulte con su proveedor de atencin mdica o con su farmacutico. MARCAS COMUNES: Cozaar Qu le debo informar a mi profesional de la salud antes de tomar este medicamento? Necesita saber si usted presenta alguno de los siguientes problemas o situaciones: -insuficiencia cardiaca -enfermedad renal o heptica -una reaccin alrgica o inusual al losartn, a otros medicamentos, alimentos, colorantes o conservantes -si est embarazada o buscando quedar embarazada -si est amamantando a un beb Cmo debo utilizar este medicamento? Tome este medicamento por va oral con un vaso de agua. Siga las instrucciones de la etiqueta del La Platamedicamento. Este medicamento se puede  tomar con o sin alimentos. Tome sus dosis a intervalos regulares. No tome su medicamento con una frecuencia mayor a la indicada. Hable con su pediatra para informarse acerca del uso de este medicamento en nios. Puede requerir atencin especial. Sobredosis: Pngase en contacto inmediatamente con un centro toxicolgico o una sala de urgencia si usted cree que haya tomado demasiado medicamento. ATENCIN: Reynolds AmericanEste medicamento es solo para usted. No comparta este medicamento con nadie. Qu sucede si me olvido de una dosis? Si olvida una dosis, tmela lo antes posible. Si es casi la hora de la prxima dosis, tome slo esa dosis. No tome dosis adicionales o dobles. Qu puede interactuar con este medicamento? -medicamentos para la presin sangunea -diurticos, especialmente triamtereno, espironolactona o amilorida -fluconazol -los AINE, medicamentos para el dolor o inflamacin, como ibuprofeno o naproxeno -sales o suplementos de potasio -rifampicina Puede ser que esta lista no menciona todas las posibles interacciones. Informe a su profesional de Beazer Homesla salud de Ingram Micro Inctodos los productos a base de hierbas, medicamentos de Methuen Townventa libre o suplementos nutritivos que est tomando. Si usted fuma, consume bebidas alcohlicas o si utiliza drogas ilegales, indqueselo tambin a su profesional de Beazer Homesla salud. Algunas sustancias pueden interactuar con su medicamento. A qu debo estar atento al usar PPL Corporationeste medicamento?  Visite a su mdico o a su profesional de la salud para chequear su evolucin peridicamente. Controle su presin sangunea como le haya indicado. Pregunte a su mdico o a su profesional de la salud cul debe ser su presin sangunea y cundo deber comunicarse con l o ella. Comunquese con su mdico o con su profesional de la salud si observa que su pulso cardiaco es rpido o irregular. Las mujeres deben informar a su mdico si estn buscando quedar embarazadas o si creen que estn embarazadas. Existe la posibilidad  de efectos secundarios graves a un beb sin nacer, particularmente durante el segundo o tercer trimestre. Para ms informacin hable con su profesional de la salud o su farmacutico. Puede experimentar somnolencia o mareos. No conduzca ni utilice maquinaria, ni haga nada que Scientist, research (life sciences) en estado de alerta hasta que sepa cmo le afecta este medicamento. No se siente ni se ponga de pie con rapidez, especialmente si es un paciente de edad avanzada. Esto reduce el riesgo de mareos o [employer]. El alcohol puede aumentar los mareos y la somnolencia. Evite consumir bebidas alcohlicas. Evite los sustitutos de la sal a menos que su mdico o su profesional de la salud indique lo contrario. No se trate usted mismo para tos, resfros o dolores mientras est tomando este medicamento sin Science writer a su mdico o a su profesional de Radiographer, therapeutic. Algunos ingredientes pueden aumentar su presin sangunea. Qu efectos secundarios puedo tener al [employer] este medicamento? Efectos secundarios que debe informar a su mdico o a Producer, television/film/video de la salud tan pronto como sea posible: -confusin, mareos, aturdimiento o Corporate investment banker -reduccin en el volumen de orina -dificultad al respirar o tragar, ronquera o estrechamiento de la garganta -pulso cardiaco rpido o irregular, palpitaciones o dolor en el pecho -erupcin cutnea, picazn -hinchazn del rostro, labios, lengua, manos o pies Efectos secundarios que, por lo general, no requieren atencin mdica (debe informarlos a su mdico o a su profesional de la salud si persisten o si son molestos): -tos -disminucin de la funcin sexual o del deseo -dolor de cabeza -congestin nasal o nariz tapada -nuseas o dolor de Teaching laboratory technician -dolores o calambres musculares Puede ser que esta lista no menciona todos los posibles efectos secundarios. Comunquese a su mdico por asesoramiento mdico [employer]. Usted puede informar los efectos secundarios a la FDA por  telfono al 1-800-FDA-1088. Dnde debo guardar mi medicina? Mantngala fuera del alcance de los nios. Gurdela a [employer]I, entre 15 y 30 grados C (44 y 57 grados F). Protjala de la luz. Mantenga el envase bien cerrado. Deseche todo el medicamento que no haya utilizado, despus de la fecha de vencimiento. ATENCIN: Este folleto es un resumen. Puede ser que no cubra toda la posible informacin. Si usted tiene preguntas acerca de esta medicina, consulte con su mdico, su farmacutico o su profesional de Radiographer, therapeutic.  2018 Elsevier/Gold Standard (2014-08-12 00:00:00)

## 2016-12-28 NOTE — Progress Notes (Signed)
Patient is here for f/up  Patient is requesting referral to mammogram

## 2016-12-28 NOTE — Progress Notes (Signed)
Subjective:  Patient ID: Lisa [name], female    DOB: Feb 19, 1971  Age: 46 y.o. MRN: [id number]  CC: Follow-up   HPI Lisa [name] presents for anxiety and depression follow up.  She has the following symptoms: difficulty concentrating, fatigue, racing thoughts, depressed mood, and panic attacks. Onset of symptoms was approximately several months ago, rapidly improving since that time. She denies current suicidal and homicidal ideation.Previous treatment includes Lexapro. Took lexapro twice before discontinuing. Reports last time she took Lexapro was 2 week ago. Counseling resources provided last visit she reports not following up with counseling resources. Denies any SI/HI.   She denies any side effects from the treatment. She reports outdoor activity as a coping mechanism. History of elevated blood pressures.  She is not exercising and is not adherent to low salt diet.  Blood pressure is not well controlled at home. Cardiac symptoms none. Patient denies chest pain, chest pressure/discomfort, claudication, dyspnea, near-syncope, palpitations and syncope.  Cardiovascular risk factors: hypertension, obesity (BMI >= 30 kg/m2) and sedentary lifestyle. Use of agents associated with hypertension: NSAIDS. History of target organ damage: none.   Outpatient Medications Prior to Visit  Medication Sig Dispense Refill  . valACYclovir (VALTREX) 500 MG tablet Take 1 tablet (500 mg total) by mouth daily. 90 tablet 3  . citalopram (CELEXA) 20 MG tablet Take 1 tablet (20 mg total) by mouth daily. 30 tablet 5   No facility-administered medications prior to visit.     ROS Review of Systems  Constitutional: Negative.   Respiratory: Negative.   Cardiovascular: Negative.   Psychiatric/Behavioral: Positive for dysphoric mood (history of depression). Negative for suicidal ideas. The patient is nervous/anxious (history of anxiety).    Objective:  BP (!) 149/81 (BP Location: Left Arm, Patient Position: Sitting, Cuff  Size: Normal)   Pulse (!) 59   Temp 98.3 F (36.8 C) (Oral)   Resp 18   Ht 5' (1.524 m)   Wt 172 lb 6.4 oz (78.2 kg)   SpO2 99%   BMI 33.67 kg/m   BP/Weight 12/28/2016 10/19/2016 9/76/7341  Systolic BP 937 902 409  Diastolic BP 81 77 83  Wt. (Lbs) 172.4 - 173.2  BMI 33.67 - 33.83   Physical Exam  Constitutional: She appears well-developed and well-nourished.  Eyes: Conjunctivae are normal. Pupils are equal, round, and reactive to light.  Neck: No JVD present.  Cardiovascular: Normal rate, regular rhythm, normal heart sounds and intact distal pulses.   Pulmonary/Chest: Effort normal and breath sounds normal.  Abdominal: Soft. Bowel sounds are normal.  Skin: Skin is warm and dry.  Psychiatric: She has a normal mood and affect. She expresses no homicidal and no suicidal ideation. She expresses no suicidal plans and no homicidal plans.  Nursing note and vitals reviewed.  Assessment & Plan:   Problem List Items Addressed This Visit    None    Visit Diagnoses    Hypertension, benign    -  Primary   Schedule BP recheck in 2 weeks with clinic RN.    If BP is greater than 90/60 (MAP 65 or greater) but not less than 130/80 may increase dose of losartan to   50 mg QD and recheck in another 2 weeks clinic RN.   Follow up in PCP in 3 months.   Relevant Medications   losartan (COZAAR) 25 MG tablet   Other Relevant Orders   CMP14+EGFR (Completed)   Lipid Panel (Completed)   POCT UA - Microalbumin (Completed)   Anxiety with depression  Relevant Orders   Ambulatory referral to Psychiatry   History of panic attacks       Relevant Orders   Ambulatory referral to Psychiatry      Meds ordered this encounter  Medications  . losartan (COZAAR) 25 MG tablet    Sig: Take 1 tablet (25 mg total) by mouth daily.    Dispense:  30 tablet    Refill:  2    Order Specific Question:   Supervising Provider    Answer:   Tresa Garter [id number]    Follow-up: Return in about 2  weeks (around 01/11/2017) for BP check clinic RN.   Alfonse Spruce FNP

## 2016-12-28 NOTE — BH Specialist Note (Signed)
Integrated Behavioral Health Initial Visit  MRN: [id number] Name: Lisa [name]   Session Start time: 3:15 PM Session End time: 3:45 PM Total time: 30 minutes  Type of Service: Integrated Behavioral Health- Individual/Family Interpretor:Yes.   Interpretor Name and Language: Georgeanna HarrisonSasha [phone] (Spanish)   Warm Hand Off Completed.       SUBJECTIVE: Lisa [name] is a 46 y.o. female accompanied by minor daughter. Patient was referred by Dr. Jenelle MagesHairston for depression and anxiety. Patient reports the following symptoms/concerns: overwhelming feelings of sadness and worry, difficulty sleeping, low energy, difficulty concentrating, and panic attacks  Duration of problem: December 2017; Severity of problem: moderate  OBJECTIVE: Mood: Anxious and Affect: Appropriate Risk of harm to self or others: No plan to harm self or others   LIFE CONTEXT: Family and Social: Pt resides with her three children. She reports having a local support system School/Work: Pt is employed and receives food stamps ($400) Self-Care: Pt practices deep breathing, drinks teas, and participates in medication management. No report of substance use Life Changes: Pt is grieving the loss of a friend (December 2017) and has increased anxiety about being the sole provider in the home  GOALS ADDRESSED: Patient will reduce symptoms of: anxiety and depression and increase knowledge and/or ability of: coping skills and also: Increase adequate support systems for patient/family and Begin healthy grieving over loss   INTERVENTIONS: Solution-Focused Strategies, Supportive Counseling, Psychoeducation and/or Health Education and Link to [facility]  Standardized Assessments completed: PHQ 2&9  ASSESSMENT: Patient currently experiencing depression and anxiety triggered by the loss of a loved one and financial strain. She reports overwhelming feelings of sadness and worry, difficulty sleeping, low energy, difficulty concentrating,  and panic attacks. Patient may benefit from psychoeducation and psychotherapy. She is currently participating in medication management through PCP. LCSWA educated pt on how stress can negatively impact one's mental and physical health. LCSWA taught grounding interventions to patient who was was successful in identifying healthy coping skills to use on a routine basis. Pt was provided resources for crisis intervention, psychotherapy, and grief support.  PLAN: 1. Follow up with behavioral health clinician on : Pt was encouraged to contact LCSWA if symptoms worsen or fail to improve to [facility]. 2. Behavioral recommendations: LCSWA recommends that pt apply healthy coping skills discussed, comply with medication management, and utilize provided resources. Pt is encouraged to schedule follow up appointment with LCSWA 3. Referral(s): [facility] and grief support 4. "From scale of 1-10, how likely are you to follow plan?": 8/10  Bridgett LarssonJasmine D Lewis, LCSW 12/29/16 5:21 PM

## 2016-12-29 ENCOUNTER — Telehealth: Payer: Self-pay | Admitting: Family Medicine

## 2016-12-29 ENCOUNTER — Other Ambulatory Visit: Payer: Self-pay | Admitting: Family Medicine

## 2016-12-29 DIAGNOSIS — M7918 Myalgia, other site: Secondary | ICD-10-CM

## 2016-12-29 LAB — LIPID PANEL
Chol/HDL Ratio: 3.7 ratio (ref 0.0–4.4)
Cholesterol, Total: 191 mg/dL (ref 100–199)
HDL: 51 mg/dL (ref >39)
LDL CALC: 115 mg/dL — AB (ref 0–99)
Triglycerides: 124 mg/dL (ref 0–149)
VLDL CHOLESTEROL CAL: 25 mg/dL (ref 5–40)

## 2016-12-29 LAB — CMP14+EGFR
ALBUMIN: 4.6 g/dL (ref 3.5–5.5)
ALT: 17 IU/L (ref 0–32)
AST: 19 IU/L (ref 0–40)
Albumin/Globulin Ratio: 1.6 (ref 1.2–2.2)
Alkaline Phosphatase: 76 IU/L (ref 39–117)
BUN / CREAT RATIO: 19 (ref 9–23)
BUN: 10 mg/dL (ref 6–24)
Bilirubin Total: 0.8 mg/dL (ref 0.0–1.2)
CALCIUM: 9.8 mg/dL (ref 8.7–10.2)
CO2: 26 mmol/L (ref 20–29)
CREATININE: 0.54 mg/dL — AB (ref 0.57–1.00)
Chloride: 100 mmol/L (ref 96–106)
GFR, EST AFRICAN AMERICAN: 134 mL/min/{1.73_m2} (ref >59)
GFR, EST NON AFRICAN AMERICAN: 116 mL/min/{1.73_m2} (ref >59)
GLUCOSE: 89 mg/dL (ref 65–99)
Globulin, Total: 2.8 g/dL (ref 1.5–4.5)
Potassium: 4.5 mmol/L (ref 3.5–5.2)
Sodium: 141 mmol/L (ref 134–144)
TOTAL PROTEIN: 7.4 g/dL (ref 6.0–8.5)

## 2016-12-29 MED ORDER — IBUPROFEN 600 MG PO TABS: 600.0000 mg | ORAL_TABLET | Freq: Three times a day (TID) | ORAL | 0 refills | Status: DC | PRN

## 2016-12-29 NOTE — Telephone Encounter (Signed)
Pt states that she was told during her appt that she would be prescribed a med for swelling and pain in her feet and legs and that she has not gotten that script. Would like a call from the CMA to verify when script has been called in.

## 2016-12-29 NOTE — Telephone Encounter (Signed)
[facility] 600 [facility]

## 2016-12-30 NOTE — Telephone Encounter (Signed)
CMA call regarding medication being sent to the pharmacy   Patient did not answer but left a detailed message per [facility]

## 2017-01-02 ENCOUNTER — Other Ambulatory Visit: Payer: Self-pay | Admitting: Family Medicine

## 2017-01-02 DIAGNOSIS — E7800 Pure hypercholesterolemia, unspecified: Secondary | ICD-10-CM | POA: Insufficient documentation

## 2017-01-02 MED ORDER — PRAVASTATIN SODIUM 10 MG PO TABS: 10.0000 mg | ORAL_TABLET | Freq: Every day | ORAL | 2 refills | Status: DC

## 2017-01-02 NOTE — Telephone Encounter (Signed)
-----   Message from Mandesia R Hairston, FNP sent at 01/02/2017  9:14 AM EDT ----- Microalbumin/creatinine ratio level was normal. This tests for protein in your urine that can indicate early signs of kidney damage.  Kidney function normal.  LDL cholesterol level is elevated. This can increase your risk of heart disease overtime. You will be prescribed pravastatin. Start eating a diet low in saturated fat. Limit your intake of fried foods, red meats, and whole milk. Increase physical activity. Recommend follow up in 3 months. 

## 2017-01-02 NOTE — Telephone Encounter (Signed)
CMA call regarding results  Patient did not answer & no VM  set up

## 2017-01-05 NOTE — Telephone Encounter (Signed)
CMA  Call patient regarding results  Patient did not answer but left a detailed message stating to call back if have any questions

## 2017-01-05 NOTE — Telephone Encounter (Signed)
-----   Message from Lizbeth BarkMandesia R Hairston, FNP sent at 01/02/2017  9:14 AM EDT ----- Microalbumin/creatinine ratio level was normal. This tests for protein in your urine that can indicate early signs of kidney damage.  Kidney function normal.  LDL cholesterol level is elevated. This can increase your risk of heart disease overtime. You will be prescribed pravastatin. Start eating a diet low in saturated fat. Limit your intake of fried foods, red meats, and whole milk. Increase physical activity. Recommend follow up in 3 months.

## 2017-01-13 ENCOUNTER — Ambulatory Visit: Payer: Self-pay | Attending: Family Medicine | Admitting: *Deleted

## 2017-01-13 VITALS — BP 120/80

## 2017-01-13 DIAGNOSIS — I10 Essential (primary) hypertension: Secondary | ICD-10-CM | POA: Insufficient documentation

## 2017-01-13 DIAGNOSIS — Z0130 Encounter for examination of blood pressure without abnormal findings: Secondary | ICD-10-CM | POA: Insufficient documentation

## 2017-01-13 NOTE — Progress Notes (Signed)
Translation assistance provided by Aida [id number].  [facility].  Pt alert and oriented and arrives in good spirits. Last OV 12/28/2016 with M.Hairston,FNP.  She denies chest pain, SOB, HA, dizziness, or blurred vision. Verified medication. Pt states medication was taken this morning. She has not picked up cholesterol medication as of yet.   Manual blood pressure reading: 124/84 and 120/80

## 2017-03-01 ENCOUNTER — Other Ambulatory Visit: Payer: Self-pay | Admitting: Family Medicine

## 2017-03-01 DIAGNOSIS — I10 Essential (primary) hypertension: Secondary | ICD-10-CM

## 2017-04-04 ENCOUNTER — Telehealth: Payer: Self-pay | Admitting: Family Medicine

## 2017-04-04 DIAGNOSIS — I10 Essential (primary) hypertension: Secondary | ICD-10-CM

## 2017-04-04 MED ORDER — LOSARTAN POTASSIUM 25 MG PO TABS: 25.0000 mg | ORAL_TABLET | Freq: Every day | ORAL | 0 refills | Status: DC

## 2017-04-04 NOTE — Telephone Encounter (Signed)
Refilled

## 2017-04-04 NOTE — Telephone Encounter (Signed)
Patient called requesting medication refill on losartan (COZAAR) 25 MG tablet, please f/up . Pt states she only has 1 pill left.

## 2017-05-10 ENCOUNTER — Encounter: Payer: Self-pay | Admitting: Family Medicine

## 2017-05-10 ENCOUNTER — Ambulatory Visit: Payer: Self-pay | Attending: Family Medicine | Admitting: Family Medicine

## 2017-05-10 VITALS — BP 141/82 | HR 69 | Temp 99.0°F | Resp 18 | Ht 60.0 in | Wt 181.8 lb

## 2017-05-10 DIAGNOSIS — M545 Low back pain, unspecified: Secondary | ICD-10-CM

## 2017-05-10 DIAGNOSIS — M79604 Pain in right leg: Secondary | ICD-10-CM | POA: Insufficient documentation

## 2017-05-10 DIAGNOSIS — E7800 Pure hypercholesterolemia, unspecified: Secondary | ICD-10-CM

## 2017-05-10 DIAGNOSIS — F32A Depression, unspecified: Secondary | ICD-10-CM

## 2017-05-10 DIAGNOSIS — Z6830 Body mass index (BMI) 30.0-30.9, adult: Secondary | ICD-10-CM | POA: Insufficient documentation

## 2017-05-10 DIAGNOSIS — M76899 Other specified enthesopathies of unspecified lower limb, excluding foot: Secondary | ICD-10-CM

## 2017-05-10 DIAGNOSIS — M7918 Myalgia, other site: Secondary | ICD-10-CM | POA: Insufficient documentation

## 2017-05-10 DIAGNOSIS — R5383 Other fatigue: Secondary | ICD-10-CM | POA: Insufficient documentation

## 2017-05-10 DIAGNOSIS — M79605 Pain in left leg: Secondary | ICD-10-CM

## 2017-05-10 DIAGNOSIS — I10 Essential (primary) hypertension: Secondary | ICD-10-CM | POA: Insufficient documentation

## 2017-05-10 DIAGNOSIS — M769 Unspecified enthesopathy, lower limb, excluding foot: Secondary | ICD-10-CM | POA: Insufficient documentation

## 2017-05-10 DIAGNOSIS — Z79899 Other long term (current) drug therapy: Secondary | ICD-10-CM | POA: Insufficient documentation

## 2017-05-10 DIAGNOSIS — Z23 Encounter for immunization: Secondary | ICD-10-CM | POA: Insufficient documentation

## 2017-05-10 DIAGNOSIS — G8929 Other chronic pain: Secondary | ICD-10-CM | POA: Insufficient documentation

## 2017-05-10 DIAGNOSIS — F329 Major depressive disorder, single episode, unspecified: Secondary | ICD-10-CM | POA: Insufficient documentation

## 2017-05-10 DIAGNOSIS — E669 Obesity, unspecified: Secondary | ICD-10-CM | POA: Insufficient documentation

## 2017-05-10 MED ORDER — LOSARTAN POTASSIUM 50 MG PO TABS: 50.0000 mg | ORAL_TABLET | Freq: Every day | ORAL | 3 refills | Status: DC

## 2017-05-10 MED ORDER — NAPROXEN 500 MG PO TABS: ORAL_TABLET | ORAL | 0 refills | Status: DC

## 2017-05-10 MED ORDER — PREDNISONE 20 MG PO TABS: 20.0000 mg | ORAL_TABLET | Freq: Every day | ORAL | 0 refills | Status: DC

## 2017-05-10 MED ORDER — TRAMADOL HCL 50 MG PO TABS: 50.0000 mg | ORAL_TABLET | Freq: Three times a day (TID) | ORAL | 0 refills | Status: DC | PRN

## 2017-05-10 MED ORDER — PRAVASTATIN SODIUM 10 MG PO TABS: 10.0000 mg | ORAL_TABLET | Freq: Every day | ORAL | 5 refills | Status: DC

## 2017-05-10 NOTE — Progress Notes (Signed)
Patient is here for medication refill   Patient complains both leg pain

## 2017-05-10 NOTE — Progress Notes (Signed)
Subjective:  Patient ID: Lisa [name], female    DOB: Aug 13, 1970  Age: 46 y.o. MRN: [id number]  CC: Follow-up   HPI Lisa MarkerJuana [name] presents for musculoskeletal complaint. She complains of myalgias for which has been present for several weeks. Pain is located in bilateral legs and hips, is described as aching, and is constant . Associated symptoms include back pain. Symptoms are worse with resting and sitting. She reports pain is worst below the knee area. She denies any history of injury or paresthesias. The patient has ibuprofen for pain relief with minimal improvement.  HTN:  She is not exercising and is not adherent to low salt diet.  Blood pressure is not well controlled at home. Cardiac symptoms none. Patient denies chest pain, chest pressure/discomfort, claudication, dyspnea, near-syncope, palpitations and syncope.  Cardiovascular risk factors: hypertension, obesity (BMI >= 30 kg/m2) and sedentary lifestyle. Use of agents associated with hypertension: NSAIDS. History of target organ damage: none. She reports symptoms of fatigue. Onset December 2017. She denies any unintentional weigh loss, poor appetite, hematochezia, or  melena. History of anxiety/depression  She denies current suicidal and homicidal ideation. She is agreeable to speaking with LCSW.  Outpatient Medications Prior to Visit  Medication Sig Dispense Refill  . ibuprofen (ADVIL,MOTRIN) 600 MG tablet Take 1 tablet (600 mg total) by mouth every 8 (eight) hours as needed. 30 tablet 0  . losartan (COZAAR) 25 MG tablet Take 1 tablet (25 mg total) by mouth daily. 30 tablet 0  . pravastatin (PRAVACHOL) 10 MG tablet Take 1 tablet (10 mg total) by mouth daily. (Patient not taking: Reported on 01/13/2017) 30 tablet 2  . valACYclovir (VALTREX) 500 MG tablet Take 1 tablet (500 mg total) by mouth daily. 90 tablet 3   No facility-administered medications prior to visit.     ROS Review of Systems  Constitutional: Positive for fatigue.    Respiratory: Negative.   Cardiovascular: Negative.   Gastrointestinal: Negative.   Musculoskeletal: Positive for back pain and myalgias.  Psychiatric/Behavioral: Negative for suicidal ideas. Dysphoric mood: history of depression. Nervous/anxious: history of anxiety.    Objective:  BP 133/81 (BP Location: Left Arm, Patient Position: Sitting, Cuff Size: Normal)   Pulse 69   Temp 99 F (37.2 C) (Oral)   Resp 18   Ht 5' (1.524 m)   Wt 181 lb 12.8 oz (82.5 kg)   BMI 35.51 kg/m   BP/Weight 05/10/2017 01/13/2017 12/28/2016  Systolic BP 133 120 149  Diastolic BP 81 80 81  Wt. (Lbs) 181.8 - 172.4  BMI 35.51 - 33.67   Physical Exam  Constitutional: She is oriented to person, place, and time. She appears well-developed and well-nourished.  Eyes: Conjunctivae are normal. Pupils are equal, round, and reactive to light.  Neck: No JVD present.  Cardiovascular: Normal rate, regular rhythm, normal heart sounds and intact distal pulses.  Pulmonary/Chest: Effort normal and breath sounds normal.  Abdominal: Soft. Bowel sounds are normal.  Musculoskeletal: Normal range of motion. She exhibits no tenderness.  Neurological: She is alert and oriented to person, place, and time. She has normal reflexes.  Skin: Skin is warm and dry.  Psychiatric: Her mood appears anxious. She expresses no homicidal and no suicidal ideation. She expresses no suicidal plans and no homicidal plans.  Nursing note and vitals reviewed.  Assessment & Plan:   1. Pain in both lower extremities  - POCT ABI Screening for Pilot No Charge  2. Chronic bilateral low back pain without sciatica  - DG Lumbar  Spine Complete; Future - naproxen (NAPROSYN) 500 MG tablet; TAKE ONE TABLET BY MOUTH  TWICE A DAY AS NEEDED WITH MEALS.  Dispense: 60 tablet; Refill: 0  3. Fatigue, unspecified type  - CBC - TSH  4. Depressive disorder  Provided contact information for pt.to f/u with LCSW and counseling resource info.  5. Elevated LDL  cholesterol level  - pravastatin (PRAVACHOL) 10 MG tablet; Take 1 tablet (10 mg total) daily by mouth.  Dispense: 30 tablet; Refill: 5  6. Needs flu shot  - Flu Vaccine QUAD 36+ mos IM  7. Essential hypertension Losartan increased. Schedule BP recheck in 2 weeks with nurse. If BP is greater than 90/60 (MAP 65 or greater) but not less than 130/80 may increase dose of losartan to 100 mg QD. and recheck in another 2 weeks.  - losartan (COZAAR) 50 MG tablet; Take 1 tablet (50 mg total) daily by mouth.  Dispense: 90 tablet; Refill: 3  8. Musculoskeletal pain  - naproxen (NAPROSYN) 500 MG tablet; TAKE ONE TABLET BY MOUTH  TWICE A DAY AS NEEDED WITH MEALS.  Dispense: 60 tablet; Refill: 0  9. Tendinitis involving hip abductors, unspecified laterality  - traMADol (ULTRAM) 50 MG tablet; Take 1 tablet (50 mg total) every 8 (eight) hours as needed by mouth.  Dispense: 30 tablet; Refill: 0 - predniSONE (DELTASONE) 20 MG tablet; Take 1 tablet (20 mg total) daily with breakfast by mouth.  Dispense: 5 tablet; Refill: 0 - naproxen (NAPROSYN) 500 MG tablet; TAKE ONE TABLET BY MOUTH  TWICE A DAY AS NEEDED WITH MEALS.  Dispense: 60 tablet; Refill: 0     Follow-up: Return in about 2 weeks (around 01/11/2017) for BP check clinic RN.   Lizbeth BarkMandesia R Zemirah Krasinski FNP

## 2017-05-11 LAB — CBC
HEMOGLOBIN: 13.1 g/dL (ref 11.1–15.9)
Hematocrit: 39.1 % (ref 34.0–46.6)
MCH: 30.1 pg (ref 26.6–33.0)
MCHC: 33.5 g/dL (ref 31.5–35.7)
MCV: 90 fL (ref 79–97)
Platelets: 281 10*3/uL (ref 150–379)
RBC: 4.35 x10E6/uL (ref 3.77–5.28)
RDW: 13.9 % (ref 12.3–15.4)
WBC: 6.2 10*3/uL (ref 3.4–10.8)

## 2017-05-11 LAB — TSH: TSH: 0.741 u[IU]/mL (ref 0.450–4.500)

## 2017-05-15 ENCOUNTER — Telehealth: Payer: Self-pay

## 2017-05-15 NOTE — Telephone Encounter (Signed)
CMA call regarding lab results   Patient did not answer but left a detailed message & to call back if have any questions  

## 2017-05-15 NOTE — Telephone Encounter (Signed)
-----   Message from Lizbeth BarkMandesia R Hairston, FNP sent at 05/12/2017  9:47 AM EST ----- Labs that evaluated your blood cells are normal. No signs of anemia. Anemia can cause fatigue. Thyroid function is normal. When abnormal it can cause fatigue.

## 2017-05-16 ENCOUNTER — Ambulatory Visit (HOSPITAL_COMMUNITY)
Admission: RE | Admit: 2017-05-16 | Discharge: 2017-05-16 | Disposition: A | Discharge status: Home / Self Care | Payer: Self-pay | Source: Ambulatory Visit | Attending: Family Medicine | Admitting: Family Medicine

## 2017-05-16 DIAGNOSIS — M545 Low back pain: Secondary | ICD-10-CM | POA: Insufficient documentation

## 2017-05-16 DIAGNOSIS — G8929 Other chronic pain: Secondary | ICD-10-CM | POA: Insufficient documentation

## 2017-05-16 DIAGNOSIS — M5136 Other intervertebral disc degeneration, lumbar region: Secondary | ICD-10-CM | POA: Insufficient documentation

## 2017-05-19 ENCOUNTER — Other Ambulatory Visit: Payer: Self-pay | Admitting: Family Medicine

## 2017-05-19 DIAGNOSIS — M5136 Other intervertebral disc degeneration, lumbar region: Secondary | ICD-10-CM

## 2017-05-22 ENCOUNTER — Ambulatory Visit: Payer: Self-pay | Attending: Family Medicine | Admitting: *Deleted

## 2017-05-22 VITALS — BP 130/80 | HR 74

## 2017-05-22 DIAGNOSIS — Z0130 Encounter for examination of blood pressure without abnormal findings: Secondary | ICD-10-CM | POA: Insufficient documentation

## 2017-05-22 DIAGNOSIS — I10 Essential (primary) hypertension: Secondary | ICD-10-CM | POA: Insufficient documentation

## 2017-05-22 NOTE — Progress Notes (Signed)
[facility] Pt alert and oriented, NAD. Pt denies chest pain, SOB, HA, dizziness, or blurred vision.  Verified medication. Pt states medication was taken this morning.  Manual blood pressure reading: 130/80

## 2017-05-23 ENCOUNTER — Telehealth: Payer: Self-pay

## 2017-05-23 NOTE — Telephone Encounter (Signed)
-----   Message from Lizbeth BarkMandesia R Hairston, FNP sent at 05/19/2017  5:23 PM EST ----- Osteoarthritis of the lower back. No fractures or misalignment of the spine. Some narrowing of the spinal discs present. You will be referred to orthopedics

## 2017-05-23 NOTE — Telephone Encounter (Signed)
CMA call regarding lab results   Patient verify DOB  Patient was aware and understood  

## 2017-09-08 LAB — POCT ABI - SCREENING FOR PILOT NO CHARGE: Other: NEGATIVE

## 2018-01-26 IMAGING — CT CT HEAD W/O CM
3 series · 16 of 47 positions shown, 19 images · non-contrast
Comparison: None.

CLINICAL DATA: Dizziness and generalized weakness. Palpitations and
headache.

EXAM:
CT HEAD WITHOUT CONTRAST
TECHNIQUE: Contiguous axial images were obtained from the base of the skull
through the vertex without intravenous contrast.

[Series 2: head wo · axial · 0.42mm/px · z∈[+1534,+1659]mm · 10 of 31 slices shown, 13 images]
[im 3/31  brain]
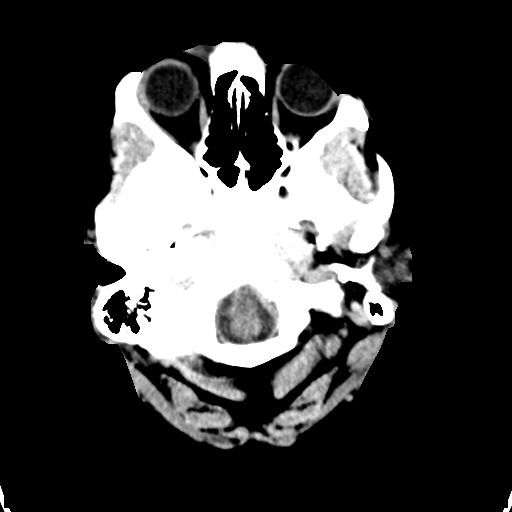
[im 3/31  bone]
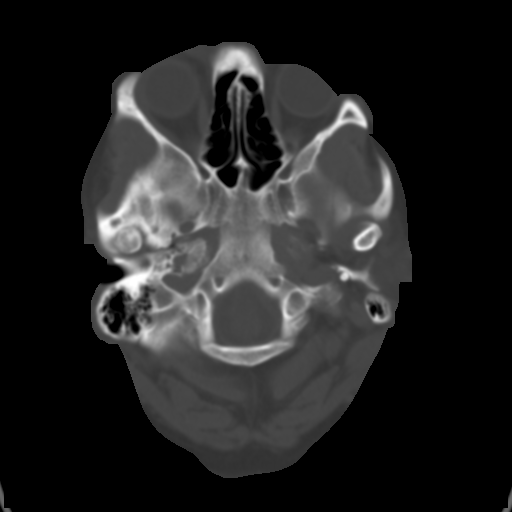
[im 6/31  brain]
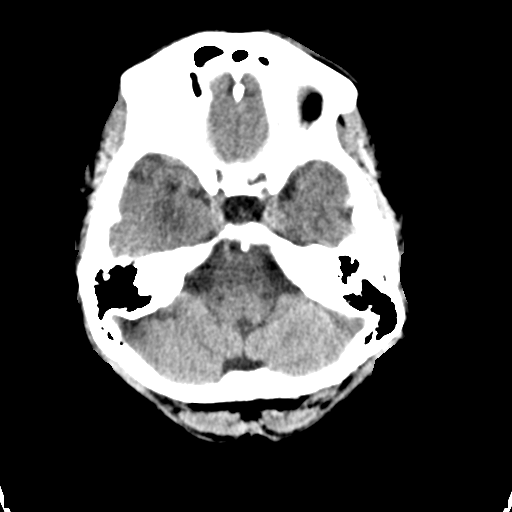
[im 9/31  brain]
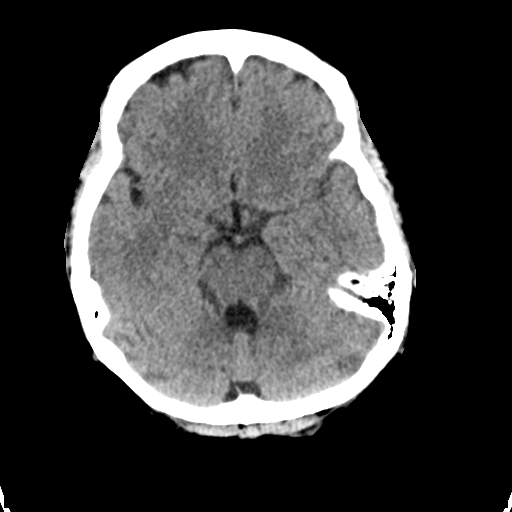
[im 11/31  brain]
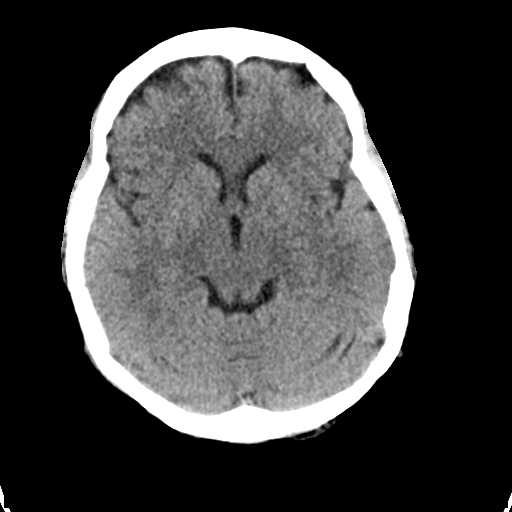
[im 14/31  brain]
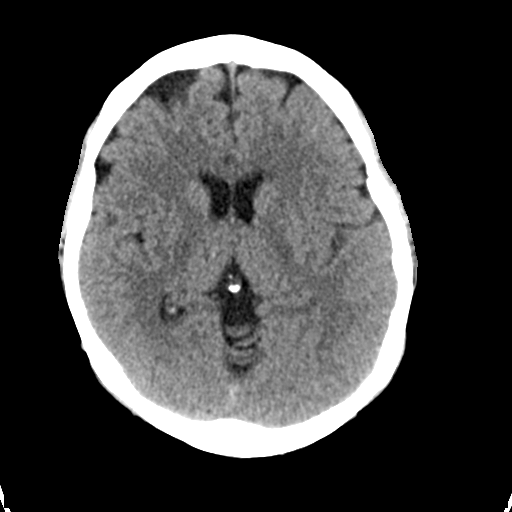
[im 14/31  bone]
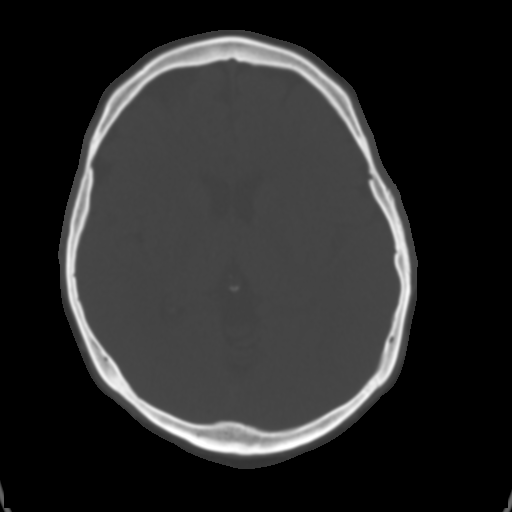
[im 17/31  brain]
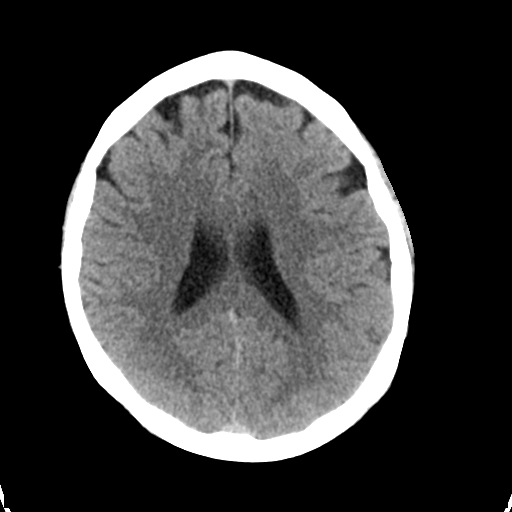
[im 20/31  brain]
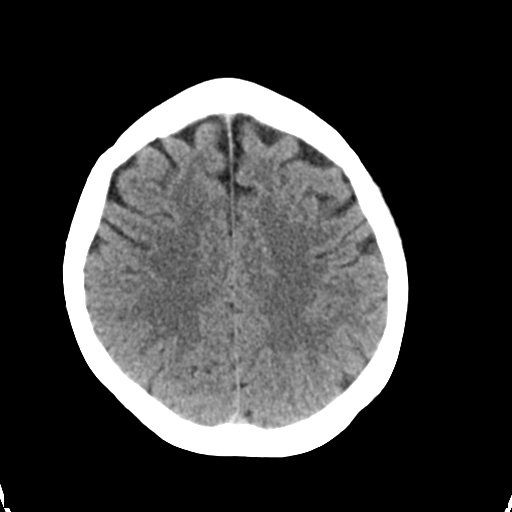
[im 23/31  brain]
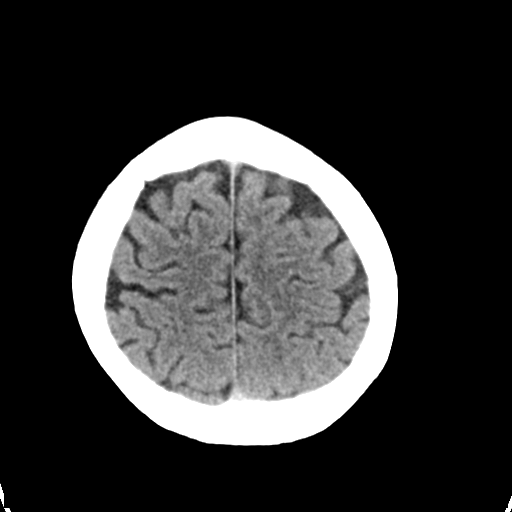
[im 25/31  brain]
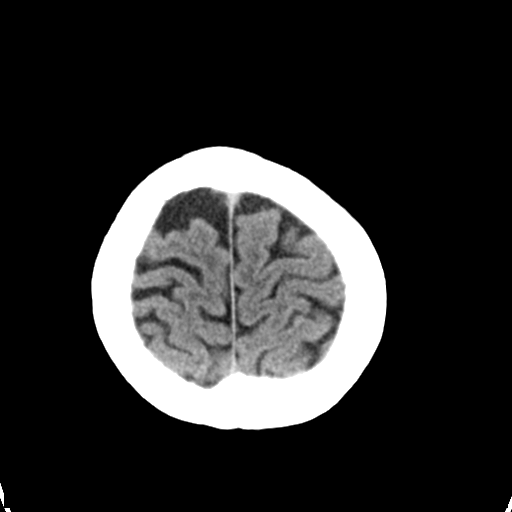
[im 25/31  bone]
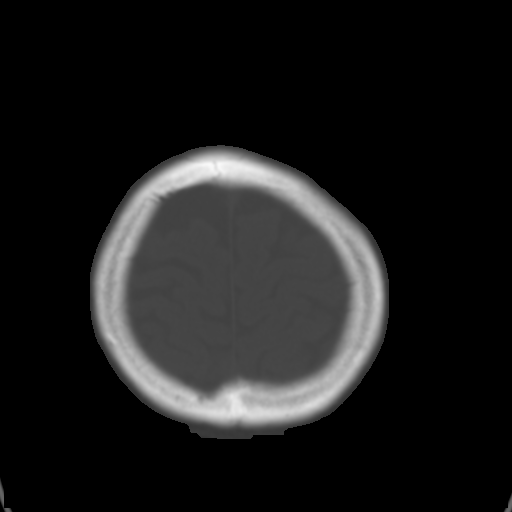
[im 28/31  brain]
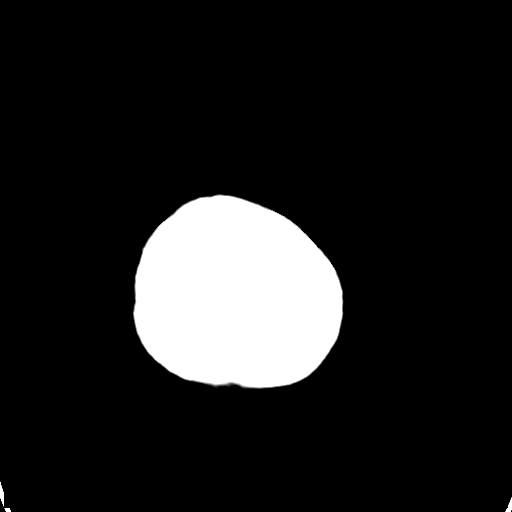

[Series 4: coronal soft tissue · coronal · 0.33mm/px · 3 of 66 slices shown]
[im 22/66  brain]
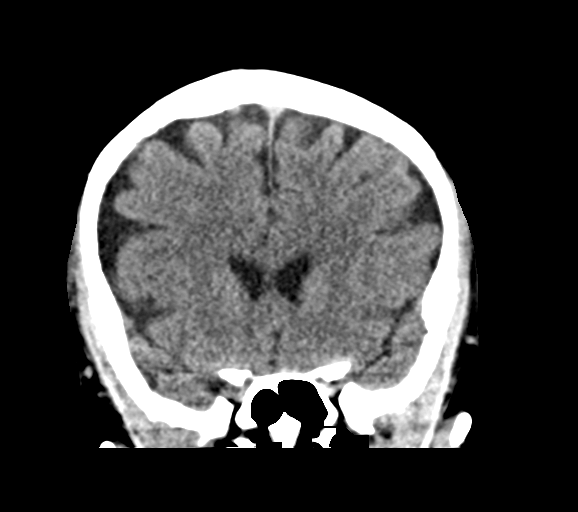
[im 29/66  brain]
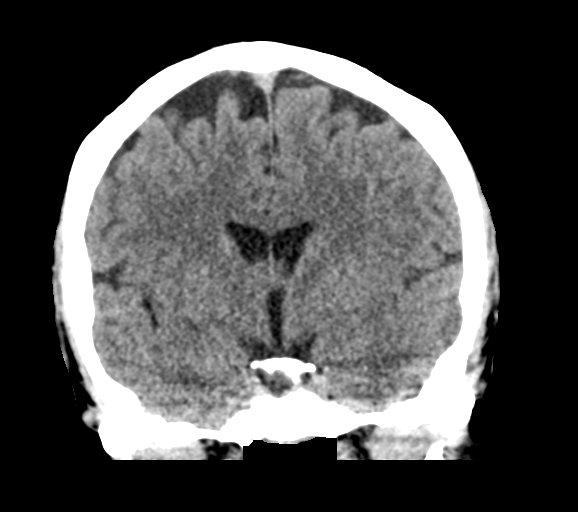
[im 37/66  brain]
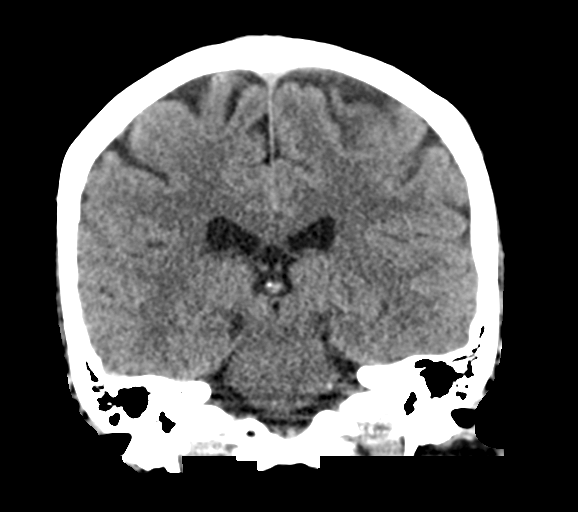

[Series 5: sagittal soft tissue · sagittal · 0.35mm/px · 3 of 62 slices shown]
[im 21/62  brain]
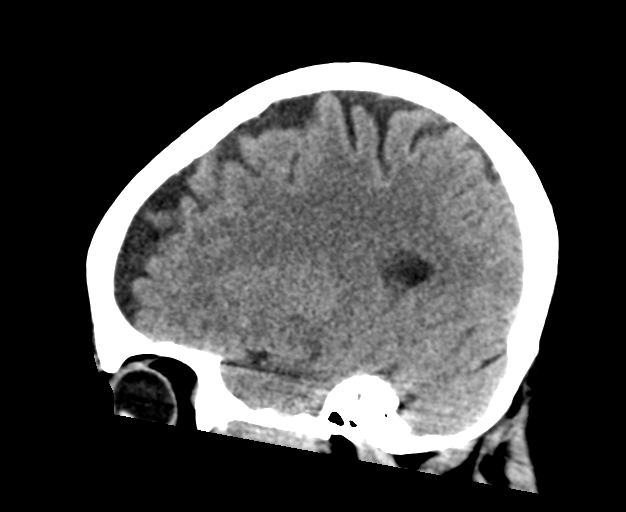
[im 31/62  brain]
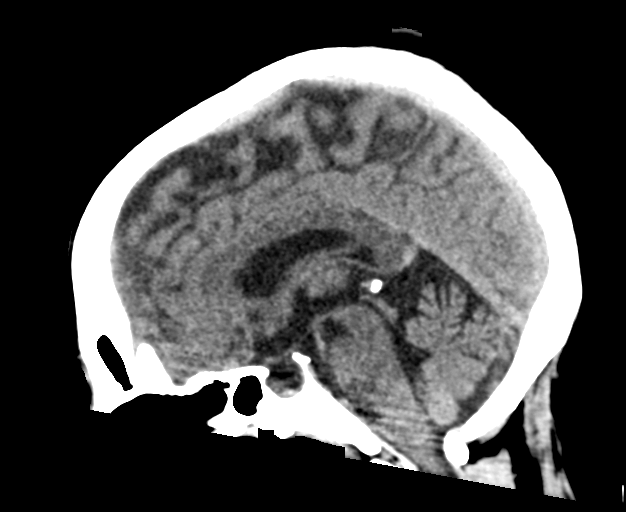
[im 41/62  brain]
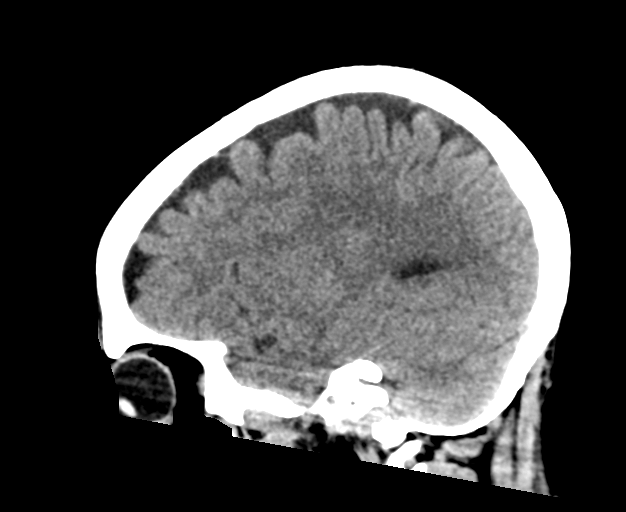

[16 of 47 positions shown; findings below may reference images not displayed]

FINDINGS: Brain: No evidence of acute infarction, hemorrhage, hydrocephalus,
extra-axial collection or mass lesion/mass effect.

Vascular: No hyperdense vessel or unexpected calcification.

Skull: Normal. Negative for fracture or focal lesion.

Sinuses/Orbits: No acute finding.

Other: None.
IMPRESSION: Normal head CT.

## 2018-07-24 ENCOUNTER — Ambulatory Visit: Payer: Self-pay | Attending: Nurse Practitioner | Admitting: Nurse Practitioner

## 2018-07-24 ENCOUNTER — Encounter: Payer: Self-pay | Admitting: Nurse Practitioner

## 2018-07-24 VITALS — BP 148/92 | HR 60 | Temp 98.1°F | Wt 179.0 lb

## 2018-07-24 DIAGNOSIS — Z8249 Family history of ischemic heart disease and other diseases of the circulatory system: Secondary | ICD-10-CM | POA: Insufficient documentation

## 2018-07-24 DIAGNOSIS — W19XXXS Unspecified fall, sequela: Secondary | ICD-10-CM | POA: Insufficient documentation

## 2018-07-24 DIAGNOSIS — I10 Essential (primary) hypertension: Secondary | ICD-10-CM | POA: Insufficient documentation

## 2018-07-24 DIAGNOSIS — M545 Low back pain, unspecified: Secondary | ICD-10-CM

## 2018-07-24 DIAGNOSIS — G43909 Migraine, unspecified, not intractable, without status migrainosus: Secondary | ICD-10-CM | POA: Insufficient documentation

## 2018-07-24 DIAGNOSIS — Z79899 Other long term (current) drug therapy: Secondary | ICD-10-CM | POA: Insufficient documentation

## 2018-07-24 DIAGNOSIS — M2550 Pain in unspecified joint: Secondary | ICD-10-CM | POA: Insufficient documentation

## 2018-07-24 DIAGNOSIS — G8929 Other chronic pain: Secondary | ICD-10-CM | POA: Insufficient documentation

## 2018-07-24 DIAGNOSIS — E782 Mixed hyperlipidemia: Secondary | ICD-10-CM | POA: Insufficient documentation

## 2018-07-24 DIAGNOSIS — E669 Obesity, unspecified: Secondary | ICD-10-CM | POA: Insufficient documentation

## 2018-07-24 DIAGNOSIS — M791 Myalgia, unspecified site: Secondary | ICD-10-CM | POA: Insufficient documentation

## 2018-07-24 MED ORDER — IBUPROFEN 800 MG PO TABS: 800.0000 mg | ORAL_TABLET | Freq: Three times a day (TID) | ORAL | 1 refills | Status: AC | PRN

## 2018-07-24 MED ORDER — GABAPENTIN 300 MG PO CAPS: 300.0000 mg | ORAL_CAPSULE | Freq: Every day | ORAL | 1 refills | Status: DC

## 2018-07-24 MED ORDER — TIZANIDINE HCL 4 MG PO TABS: 4.0000 mg | ORAL_TABLET | Freq: Every day | ORAL | 1 refills | Status: AC

## 2018-07-24 MED ORDER — PRAVASTATIN SODIUM 10 MG PO TABS: 10.0000 mg | ORAL_TABLET | Freq: Every day | ORAL | 1 refills | Status: DC

## 2018-07-24 MED ORDER — LOSARTAN POTASSIUM 50 MG PO TABS: 50.0000 mg | ORAL_TABLET | Freq: Every day | ORAL | 3 refills | Status: DC

## 2018-07-24 NOTE — Progress Notes (Signed)
Assessment & Plan:  Lisa [name] was seen today for establish care and back pain.  Diagnoses and all orders for this visit:  Chronic bilateral low back pain without sciatica -     CBC -     CMP14+EGFR -     DG Lumbar Spine Complete; Future -     tiZANidine (ZANAFLEX) 4 MG tablet; Take 1 tablet (4 mg total) by mouth at bedtime for 30 days. -     ibuprofen (ADVIL,MOTRIN) 800 MG tablet; Take 1 tablet (800 mg total) by mouth every 8 (eight) hours as needed for up to 30 days. Work on losing weight to help reduce back pain. May alternate with heat and ice application for pain relief. May also alternate with acetaminophen and Ibuprofen as prescribed for back pain. Other alternatives include massage, acupuncture and water aerobics.  You must stay active and avoid a sedentary lifestyle.    Essential hypertension -     losartan (COZAAR) 50 MG tablet; Take 1 tablet (50 mg total) by mouth daily. Continue all antihypertensives as prescribed.  Remember to bring in your blood pressure log with you for your follow up appointment.  DASH/Mediterranean Diets are healthier choices for HTN.    Mixed hyperlipidemia -     Lipid panel -     pravastatin (PRAVACHOL) 10 MG tablet; Take 1 tablet (10 mg total) by mouth daily. INSTRUCTIONS: Work on a low fat, heart healthy diet and participate in regular aerobic exercise program by working out at least 150 minutes per week; 5 days a week-30 minutes per day. Avoid red meat, fried foods. junk foods, sodas, sugary drinks, unhealthy snacking, alcohol and smoking.  Drink at least 48oz of water per day and monitor your carbohydrate intake daily.      Patient has been counseled on age-appropriate routine health concerns for screening and prevention. These are reviewed and up-to-date. Referrals have been placed accordingly. Immunizations are up-to-date or declined.    Subjective:   Chief Complaint  Patient presents with  . Establish Care  . Back Pain   HPI Lisa [name]  48 y.o. female presents to office today to establish care. She has a PMH of anxiety with Depression, HTN, migraines and chronic arthralgia and myalgia in the neck, bilateral legs, hips and low back. Xray of lumbar spine 05-16-2017 showed DDD and narrowing in the lumbar spine. A referral was placed to orthopedic specialist by her PCP in 2018. It does not appear that she followed up. Previous medications for back pain include naproxen, prednisone and tramadol. She endorses falling at work many years ago and was never evaluated for that injury. She denies any sciatica. The pain is described as burning. Aggravating factors: sitting for prolonged periods of time.  Relieving factors: ibuprofen. She is obese and does not exercise. Pain is described as aching and constant.    CHRONIC HYPERTENSION Disease Monitoring She has not been taking her blood medication as prescribed. She has not been seen in this office in over a year.   Blood pressure range Blood pressure not at goal today. Possibly related to pain. Will not make any changes with Losartan today.  BP Readings from Last 3 Encounters:  07/24/18 (!) 148/92  05/22/17 130/80  05/10/17 (!) 141/82   Chest pain: no   Dyspnea: no   Claudication: no  Medication compliance: yes, taking losartan 50 mg daily  Medication Side Effects  Lightheadedness: no   Urinary frequency: no   Edema: no   Impotence: no  Preventitive Healthcare:  Exercise: no   Diet Pattern: diet: general  Salt Restriction:  No   Mixed Hyperlipidemia She is not taking pravastatin 52m daily as prescribed. She denies any statin intolerance or myalgias. She ran out of the medications 2 weeks ago however the scripts were written initially in 2018. Not clear how she still has pravastatin available. LDL not at goal.  Lab Results  Component Value Date   LDLCALC 111 (H) 07/24/2018    Anxiety and Depression She has taken Celexa in the past as well as lexapro. She delcines the  initiation of any SSRIs today or to speak with the onsite counselor.   She denies any thoughts of self-harm. [facility] 07/24/2018 05/10/2017 12/28/2016 10/13/2016 09/01/2016  Decreased Interest _0 Down, Depressed, Hopeless _1 PHQ - 2 Score _2 Altered sleeping 0 _3 0  Tired, decreased energy _4 Change in appetite 0 0 0 0 0  Feeling bad or failure about yourself  0 0 0 0 0  Trouble concentrating _5 Moving slowly or fidgety/restless 0 0 0 3 2  Suicidal thoughts 0 0 0 0 0  PHQ-9 Score _6 Review of Systems  Constitutional: Negative for fever, malaise/fatigue and weight loss.  HENT: Negative.  Negative for nosebleeds.   Eyes: Negative.  Negative for blurred vision, double vision and photophobia.  Respiratory: Negative.  Negative for cough and shortness of breath.   Cardiovascular: Negative.  Negative for chest pain, palpitations and leg swelling.  Gastrointestinal: Negative.  Negative for heartburn, nausea and vomiting.  Musculoskeletal: Positive for back pain and myalgias.       SEE HPI  Neurological: Negative.  Negative for dizziness, focal weakness, seizures and headaches.  Psychiatric/Behavioral: Positive for depression. Negative for suicidal ideas. The patient is nervous/anxious.     Past Medical History:  Diagnosis Date  . Hypertension   . Migraine     History reviewed. No pertinent surgical history.  Family History  Problem Relation Age of Onset  . Diabetes Mother   . Hypertension Mother     Social History Reviewed with no changes to be made today.   Outpatient Medications Prior to Visit  Medication Sig Dispense Refill  . valACYclovir (VALTREX) 500 MG tablet Take 1 tablet (500 mg total) by mouth daily. 90 tablet 3  . ibuprofen (ADVIL,MOTRIN) 600 MG tablet Take 1 tablet (600 mg total) by mouth every 8 (eight) hours as needed. (Patient not taking: Reported on 05/22/2017) 30 tablet 0  . losartan (COZAAR)  50 MG tablet Take 1 tablet (50 mg total) daily by mouth. (Patient not taking: Reported on 07/24/2018) 90 tablet 3  . naproxen (NAPROSYN) 500 MG tablet TAKE ONE TABLET BY MOUTH  TWICE A DAY AS NEEDED WITH MEALS. (Patient not taking: Reported on 07/24/2018) 60 tablet 0  . pravastatin (PRAVACHOL) 10 MG tablet Take 1 tablet (10 mg total) daily by mouth. (Patient not taking: Reported on 07/24/2018) 30 tablet 5  . predniSONE (DELTASONE) 20 MG tablet Take 1 tablet (20 mg total) daily with breakfast by mouth. (Patient not taking: Reported on 07/24/2018) 5 tablet 0  . traMADol (ULTRAM) 50 MG tablet Take 1 tablet (50 mg total) every 8 (eight) hours as needed by mouth. (Patient not taking: Reported on 07/24/2018) 30 tablet 0   No facility-administered medications  prior to visit.     No Known Allergies     Objective:    BP (!) 148/92   Pulse 60   Temp 98.1 F (36.7 C) (Oral)   Wt 179 lb (81.2 kg)   SpO2 99%   BMI 34.96 kg/m  Wt Readings from Last 3 Encounters:  07/24/18 179 lb (81.2 kg)  05/10/17 181 lb 12.8 oz (82.5 kg)  12/28/16 172 lb 6.4 oz (78.2 kg)    Physical Exam Vitals signs and nursing note reviewed.  Constitutional:      Appearance: She is well-developed.  HENT:     Head: Normocephalic and atraumatic.  Neck:     Musculoskeletal: Normal range of motion.  Cardiovascular:     Rate and Rhythm: Normal rate and regular rhythm.     Heart sounds: Normal heart sounds. No murmur. No friction rub. No gallop.   Pulmonary:     Effort: Pulmonary effort is normal. No tachypnea or respiratory distress.     Breath sounds: Normal breath sounds. No decreased breath sounds, wheezing, rhonchi or rales.  Chest:     Chest wall: No tenderness.  Abdominal:     General: Bowel sounds are normal.     Palpations: Abdomen is soft.  Musculoskeletal: Normal range of motion.  Skin:    General: Skin is warm and dry.  Neurological:     Mental Status: She is alert and oriented to person, place, and time.      Coordination: Coordination normal.  Psychiatric:        Behavior: Behavior normal. Behavior is cooperative.        Thought Content: Thought content normal.        Judgment: Judgment normal.        Patient has been counseled extensively about nutrition and exercise as well as the importance of adherence with medications and regular follow-up. The patient was given clear instructions to go to ER or return to medical center if symptoms don't improve, worsen or new problems develop. The patient verbalized understanding.   Follow-up: Return in about 6 weeks (around 09/04/2018) for low back pain, Needs appointment with financial representative.Gildardo Pounds, FNP-BC [facility] [place], [place]   07/25/2018, 1:21 PM

## 2018-07-24 NOTE — Patient Instructions (Signed)
Dolor de espalda crónico °Chronic Back Pain °Cuando el dolor de espalda dura más de 3 meses se denomina dolor de espalda crónico. Es posible que se desconozca la causa de su dolor de espalda. Algunas causas frecuentes son las siguientes: °· Desgaste (enfermedad degenerativa) de los huesos, los ligamentos o los discos de la espalda. °· Inflamación y rigidez en la espalda (artritis). °Las personas que sufren dolor de espalda crónico generalmente atraviesan determinados períodos en los que este es más intenso (episodios de exacerbación del dolor). Muchas personas pueden aprender a controlar el dolor de espalda con el cuidado en el hogar. °Sigue estas indicaciones en tu casa: °Esté atento a cualquier cambio en los síntomas. Tome estas medidas para aliviar el dolor: °Actividad ° °· Evite agacharse y realizar otras actividades que agraven el problema. °· Mantenga una postura correcta mientras está de pie o sentado: °? Cuando esté de pie, mantenga la parte alta de la espalda y el cuello rectos, con los hombros hacia atrás. Evite encorvarse. °? Cuando esté sentado, mantenga la espalda recta y relaje los hombros. No curve los hombros ni los eche hacia atrás. °· No permanezca sentado o de pie en el mismo lugar durante mucho tiempo. °· Durante el día, descanse durante lapsos breves. Esto le aliviará el dolor. Descansar recostado o de pie suele ser mejor que hacerlo sentado. °· Cuando descanse durante períodos más largos, incorpore alguna actividad suave o ejercicios de elongación entre uno y otro. Esto ayudará a evitar la rigidez y el dolor. °· Haga ejercicio con regularidad. Pregúntele al médico qué actividades son seguras para usted. °· No levante ningún objeto que pese más de 10 libras (4.5 kg). Siempre use las técnicas correctas para levantar objetos, entre ellas: °? Flexionar las rodillas. °? Mantener la carga cerca del cuerpo. °? No torcerse. °· Duerma sobre un colchón firme en una posición cómoda. Intente acostarse de  costado, con las rodillas ligeramente flexionadas. Si se recuesta sobre la espalda, coloque una almohada debajo de las rodillas. °Control del dolor °· Si se lo indican, aplique hielo sobre la zona dolorida. El médico puede recomendarle que se aplique hielo durante las primeras 24 a 48 horas después del comienzo de un episodio de exacerbación del dolor. °? Ponga el hielo en una bolsa plástica. °? Coloque una toalla entre la piel y la bolsa. °? Coloque el hielo durante 20 minutos, 2 a 3 veces al día. °· Si se lo indican, aplique calor en la zona afectada con la frecuencia que le haya indicado el médico. Use la fuente de calor que el médico le recomiende, como una compresa de calor húmedo o una almohadilla térmica. °? Colóquese una toalla entre la piel y la fuente de calor. °? Aplique calor durante 20 a 30 minutos. °? Retire la fuente de calor si la piel se pone de color rojo brillante. Esto es especialmente importante si no puede sentir dolor, calor o frío. Puede correr un riesgo mayor de sufrir quemaduras. °· Intente tomar un baño de inmersión con agua caliente. °· Toma los medicamentos de venta libre y los recetados solamente como se lo haya indicado el médico. °· Concurrir a todas las visitas de seguimiento como se lo haya indicado el médico. Esto es importante. °Comunícate con un médico si: °· Siente un dolor que no se alivia con reposo o medicamentos. °Solicite ayuda inmediatamente si: °· Siente debilidad o adormecimiento en una o ambas piernas, o en uno o ambos pies. °· Tiene dificultad para controlar la micción o la defecación. °· Tiene náuseas o vómitos. °· Tiene dolor   en el abdomen. °· Le falta el aire o se desmaya. °Esta información no tiene como fin reemplazar el consejo del médico. Asegúrese de hacerle al médico cualquier pregunta que tenga. °Document Released: 06/20/2005 Document Revised: 01/26/2018 Document Reviewed: 04/17/2017 °Elsevier Interactive Patient Education © 2019 [employer] ° °

## 2018-07-25 ENCOUNTER — Encounter: Payer: Self-pay | Admitting: Nurse Practitioner

## 2018-07-25 LAB — CMP14+EGFR
ALK PHOS: 98 IU/L (ref 39–117)
ALT: 25 IU/L (ref 0–32)
AST: 19 IU/L (ref 0–40)
Albumin/Globulin Ratio: 1.4 (ref 1.2–2.2)
Albumin: 4.6 g/dL (ref 3.8–4.8)
BUN/Creatinine Ratio: 21 (ref 9–23)
BUN: 12 mg/dL (ref 6–24)
Bilirubin Total: 0.5 mg/dL (ref 0.0–1.2)
CO2: 23 mmol/L (ref 20–29)
CREATININE: 0.56 mg/dL — AB (ref 0.57–1.00)
Calcium: 9.8 mg/dL (ref 8.7–10.2)
Chloride: 102 mmol/L (ref 96–106)
GFR calc Af Amer: 130 mL/min/{1.73_m2} (ref >59)
GFR calc non Af Amer: 113 mL/min/{1.73_m2} (ref >59)
GLOBULIN, TOTAL: 3.2 g/dL (ref 1.5–4.5)
GLUCOSE: 90 mg/dL (ref 65–99)
Potassium: 5.2 mmol/L (ref 3.5–5.2)
SODIUM: 140 mmol/L (ref 134–144)
Total Protein: 7.8 g/dL (ref 6.0–8.5)

## 2018-07-25 LAB — LIPID PANEL
Chol/HDL Ratio: 4.1 ratio (ref 0.0–4.4)
Cholesterol, Total: 186 mg/dL (ref 100–199)
HDL: 45 mg/dL (ref >39)
LDL Calculated: 111 mg/dL — ABNORMAL HIGH (ref 0–99)
Triglycerides: 150 mg/dL — ABNORMAL HIGH (ref 0–149)
VLDL Cholesterol Cal: 30 mg/dL (ref 5–40)

## 2018-07-25 LAB — CBC
Hematocrit: 41.4 % (ref 34.0–46.6)
Hemoglobin: 14.1 g/dL (ref 11.1–15.9)
MCH: 29.4 pg (ref 26.6–33.0)
MCHC: 34.1 g/dL (ref 31.5–35.7)
MCV: 86 fL (ref 79–97)
Platelets: 316 10*3/uL (ref 150–450)
RBC: 4.79 x10E6/uL (ref 3.77–5.28)
RDW: 12.8 % (ref 11.7–15.4)
WBC: 6.6 10*3/uL (ref 3.4–10.8)

## 2018-07-27 ENCOUNTER — Telehealth (INDEPENDENT_AMBULATORY_CARE_PROVIDER_SITE_OTHER): Payer: Self-pay

## 2018-07-27 NOTE — Telephone Encounter (Signed)
Call placed using pacific interpreter [phone]) left voicemail notifying patient that labs normal except cholesterol being slightly elevated. Advised patient to limit or completely eliminate red meat or beef, fried foods and junk foods from your diet in order to help reduce cholesterol. Return call to [facility] at [phone] with any questions or concerns. Maryjean Morn, CMA

## 2018-07-27 NOTE — Telephone Encounter (Signed)
-----   Message from Claiborne Rigg, NP sent at 07/25/2018  1:44 PM EST ----- Labs are essentially normal outside of cholesterol levels which are slightly elevated. Foods you should eliminate or limit in your diet to help reduce your cholesterol include INSTRUCTIONS: red meat or beef, fried foods. junk foods.

## 2018-09-04 ENCOUNTER — Ambulatory Visit: Payer: Self-pay | Admitting: Nurse Practitioner

## 2019-09-30 ENCOUNTER — Other Ambulatory Visit: Payer: Self-pay

## 2019-09-30 ENCOUNTER — Ambulatory Visit: Payer: Self-pay | Attending: Nurse Practitioner | Admitting: Nurse Practitioner

## 2019-09-30 ENCOUNTER — Encounter: Payer: Self-pay | Admitting: Nurse Practitioner

## 2019-09-30 VITALS — BP 160/82 | HR 71 | Temp 97.7°F | Ht 60.0 in | Wt 181.0 lb

## 2019-09-30 DIAGNOSIS — Z01419 Encounter for gynecological examination (general) (routine) without abnormal findings: Secondary | ICD-10-CM

## 2019-09-30 DIAGNOSIS — K5901 Slow transit constipation: Secondary | ICD-10-CM

## 2019-09-30 DIAGNOSIS — R5383 Other fatigue: Secondary | ICD-10-CM

## 2019-09-30 DIAGNOSIS — E782 Mixed hyperlipidemia: Secondary | ICD-10-CM

## 2019-09-30 DIAGNOSIS — Z131 Encounter for screening for diabetes mellitus: Secondary | ICD-10-CM

## 2019-09-30 DIAGNOSIS — I10 Essential (primary) hypertension: Secondary | ICD-10-CM

## 2019-09-30 DIAGNOSIS — Z114 Encounter for screening for human immunodeficiency virus [HIV]: Secondary | ICD-10-CM

## 2019-09-30 MED ORDER — LOSARTAN POTASSIUM 50 MG PO TABS: 50.0000 mg | ORAL_TABLET | Freq: Every day | ORAL | 3 refills | Status: DC

## 2019-09-30 MED ORDER — PRAVASTATIN SODIUM 10 MG PO TABS: 10.0000 mg | ORAL_TABLET | Freq: Every day | ORAL | 1 refills | Status: DC

## 2019-09-30 MED ORDER — DOCUSATE SODIUM 100 MG PO CAPS: 100.0000 mg | ORAL_CAPSULE | Freq: Every day | ORAL | 1 refills | Status: AC

## 2019-09-30 NOTE — Progress Notes (Addendum)
Assessment & Plan:  Lisa [name] was seen today for annual exam.  Diagnoses and all orders for this visit:  Well woman exam with routine gynecological exam -     Cervicovaginal ancillary only -     Cytology - PAP  Essential hypertension -     CMP14+EGFR -     losartan (COZAAR) 50 MG tablet; Take 1 tablet (50 mg total) by mouth daily. Continue all antihypertensives as prescribed.  She is not taking her blood pressure medication. States she had not been able to pick it up. Will start today and have her return for BP check Denies chest pain, shortness of breath, palpitations, lightheadedness, dizziness, headaches or BLE edema.    Remember to bring in your blood pressure log with you for your follow up appointment.  DASH/Mediterranean Diets are healthier choices for HTN.    Mixed hyperlipidemia -     Lipid panel -     pravastatin (PRAVACHOL) 10 MG tablet; Take 1 tablet (10 mg total) by mouth daily. INSTRUCTIONS: Work on a low fat, heart healthy diet and participate in regular aerobic exercise program by working out at least 150 minutes per week; 5 days a week-30 minutes per day. Avoid red meat/beef/steak,  fried foods. junk foods, sodas, sugary drinks, unhealthy snacking, alcohol and smoking.  Drink at least 80 oz of water per day and monitor your carbohydrate intake daily.   Fatigue, unspecified type -     CBC  Encounter for screening for diabetes mellitus -     Hemoglobin A1c  Encounter for screening for HIV -     HIV antibody (with reflex)  Slow transit constipation -     docusate sodium (COLACE) 100 MG capsule; Take 1 capsule (100 mg total) by mouth daily.    Patient has been counseled on age-appropriate routine health concerns for screening and prevention. These are reviewed and up-to-date. Referrals have been placed accordingly. Immunizations are up-to-date or declined.    Subjective:   Chief Complaint  Patient presents with  . Annual Exam    Pt. is here for a physical.      HPI Lisa [name] 49 y.o. female presents to office today for well woman exam.    BP Readings from Last 3 Encounters:  09/30/19 (!) 160/82  07/24/18 (!) 148/92  05/22/17 130/80   Review of Systems  Constitutional: Negative for fever, malaise/fatigue and weight loss.  HENT: Negative.  Negative for nosebleeds.   Eyes: Negative.  Negative for blurred vision, double vision and photophobia.  Respiratory: Negative.  Negative for cough and shortness of breath.   Cardiovascular: Negative.  Negative for chest pain, palpitations and leg swelling.  Gastrointestinal: Positive for constipation. Negative for heartburn, nausea and vomiting.       Early satiety  Genitourinary:       AUB with metrorrhagia  Musculoskeletal: Negative.  Negative for myalgias.  Neurological: Negative.  Negative for dizziness, focal weakness, seizures and headaches.  Psychiatric/Behavioral: Negative.  Negative for suicidal ideas.    Past Medical History:  Diagnosis Date  . Anxiety   . Arthritis   . Depression   . Hypertension   . Migraine     History reviewed. No pertinent surgical history.  Family History  Problem Relation Age of Onset  . Diabetes Mother   . Hypertension Mother     Social History Reviewed with no changes to be made today.   Outpatient Medications Prior to Visit  Medication Sig Dispense Refill  . losartan (COZAAR)  50 MG tablet Take 1 tablet (50 mg total) by mouth daily. (Patient not taking: Reported on 09/30/2019) 90 tablet 3  . pravastatin (PRAVACHOL) 10 MG tablet Take 1 tablet (10 mg total) by mouth daily. 90 tablet 1  . valACYclovir (VALTREX) 500 MG tablet Take 1 tablet (500 mg total) by mouth daily. 90 tablet 3   No facility-administered medications prior to visit.    No Known Allergies     Objective:    BP (!) 160/82 (BP Location: Left Arm, Patient Position: Sitting, Cuff Size: Large)   Pulse 71   Temp 97.7 F (36.5 C) (Temporal)   Ht 5' (1.524 m)   Wt 181 lb (82.1 kg)    LMP 03/01/2019   SpO2 99%   BMI 35.35 kg/m  Wt Readings from Last 3 Encounters:  09/30/19 181 lb (82.1 kg)  07/24/18 179 lb (81.2 kg)  05/10/17 181 lb 12.8 oz (82.5 kg)    Physical Exam Vitals and nursing note reviewed. Exam conducted with a chaperone present.  Constitutional:      Appearance: She is well-developed.  HENT:     Head: Normocephalic and atraumatic.  Cardiovascular:     Rate and Rhythm: Normal rate and regular rhythm.     Heart sounds: Normal heart sounds. No murmur. No friction rub. No gallop.   Pulmonary:     Effort: Pulmonary effort is normal. No tachypnea or respiratory distress.     Breath sounds: Normal breath sounds. No decreased breath sounds, wheezing, rhonchi or rales.  Chest:     Chest wall: No tenderness.     Breasts:        Right: Normal. No swelling, bleeding, inverted nipple, mass, nipple discharge, skin change or tenderness.        Left: Normal. No swelling, bleeding, inverted nipple, mass, nipple discharge, skin change or tenderness.  Abdominal:     General: Abdomen is protuberant. Bowel sounds are normal.     Palpations: Abdomen is soft.  Genitourinary:    Exam position: Lithotomy position.     Labia:        Right: No rash, tenderness, lesion or injury.        Left: No rash, tenderness, lesion or injury.      Urethra: No prolapse, urethral pain, urethral swelling or urethral lesion.     Vagina: Normal.     Cervix: Normal.     Uterus: Tender.      Adnexa:        Right: No mass, tenderness or fullness.         Left: No mass, tenderness or fullness.       Rectum: Normal.  Musculoskeletal:        General: Normal range of motion.     Cervical back: Normal range of motion.  Lymphadenopathy:     Upper Body:     Right upper body: No supraclavicular, axillary or pectoral adenopathy.     Left upper body: No supraclavicular, axillary or pectoral adenopathy.  Skin:    General: Skin is warm and dry.  Neurological:     Mental Status: She is  alert and oriented to person, place, and time.     Coordination: Coordination normal.  Psychiatric:        Behavior: Behavior normal. Behavior is cooperative.        Thought Content: Thought content normal.        Judgment: Judgment normal.          Patient has been  counseled extensively about nutrition and exercise as well as the importance of adherence with medications and regular follow-up. The patient was given clear instructions to go to ER or return to medical center if symptoms don't improve, worsen or new problems develop. The patient verbalized understanding.   Follow-up: Return in about 3 weeks (around 10/21/2019) for BP recheck; abdominal pain.   Gildardo Pounds, FNP-BC [facility] [place], [place]   09/30/2019, 12:02 PM

## 2019-10-01 LAB — LIPID PANEL
Chol/HDL Ratio: 3.2 ratio (ref 0.0–4.4)
Cholesterol, Total: 180 mg/dL (ref 100–199)
HDL: 56 mg/dL (ref >39)
LDL Chol Calc (NIH): 108 mg/dL — ABNORMAL HIGH (ref 0–99)
Triglycerides: 87 mg/dL (ref 0–149)
VLDL Cholesterol Cal: 16 mg/dL (ref 5–40)

## 2019-10-01 LAB — CMP14+EGFR
ALT: 24 IU/L (ref 0–32)
AST: 23 IU/L (ref 0–40)
Albumin/Globulin Ratio: 1.6 (ref 1.2–2.2)
Albumin: 4.3 g/dL (ref 3.8–4.8)
Alkaline Phosphatase: 103 IU/L (ref 39–117)
BUN/Creatinine Ratio: 18 (ref 9–23)
BUN: 10 mg/dL (ref 6–24)
Bilirubin Total: 0.6 mg/dL (ref 0.0–1.2)
CO2: 24 mmol/L (ref 20–29)
Calcium: 10 mg/dL (ref 8.7–10.2)
Chloride: 102 mmol/L (ref 96–106)
Creatinine, Ser: 0.55 mg/dL — ABNORMAL LOW (ref 0.57–1.00)
GFR calc Af Amer: 130 mL/min/{1.73_m2} (ref >59)
GFR calc non Af Amer: 113 mL/min/{1.73_m2} (ref >59)
Globulin, Total: 2.7 g/dL (ref 1.5–4.5)
Glucose: 101 mg/dL — ABNORMAL HIGH (ref 65–99)
Potassium: 5.1 mmol/L (ref 3.5–5.2)
Sodium: 140 mmol/L (ref 134–144)
Total Protein: 7 g/dL (ref 6.0–8.5)

## 2019-10-01 LAB — CBC
Hematocrit: 43.2 % (ref 34.0–46.6)
Hemoglobin: 14.5 g/dL (ref 11.1–15.9)
MCH: 29.9 pg (ref 26.6–33.0)
MCHC: 33.6 g/dL (ref 31.5–35.7)
MCV: 89 fL (ref 79–97)
Platelets: 276 10*3/uL (ref 150–450)
RBC: 4.85 x10E6/uL (ref 3.77–5.28)
RDW: 12.9 % (ref 11.7–15.4)
WBC: 5.5 10*3/uL (ref 3.4–10.8)

## 2019-10-01 LAB — CERVICOVAGINAL ANCILLARY ONLY
Bacterial Vaginitis (gardnerella): POSITIVE — AB
Candida Glabrata: NEGATIVE
Candida Vaginitis: NEGATIVE
Chlamydia: NEGATIVE
Comment: NEGATIVE
Comment: NEGATIVE
Comment: NEGATIVE
Comment: NEGATIVE
Comment: NEGATIVE
Comment: NORMAL
Neisseria Gonorrhea: NEGATIVE
Trichomonas: NEGATIVE

## 2019-10-01 LAB — HEMOGLOBIN A1C
Est. average glucose Bld gHb Est-mCnc: 117 mg/dL
Hgb A1c MFr Bld: 5.7 % — ABNORMAL HIGH (ref 4.8–5.6)

## 2019-10-01 LAB — HIV ANTIBODY (ROUTINE TESTING W REFLEX): HIV Screen 4th Generation wRfx: NONREACTIVE

## 2019-10-03 LAB — CYTOLOGY - PAP
Comment: NEGATIVE
Diagnosis: UNDETERMINED — AB
High risk HPV: NEGATIVE

## 2019-10-04 ENCOUNTER — Emergency Department (HOSPITAL_COMMUNITY)
Admission: EM | Admit: 2019-10-04 | Discharge: 2019-10-05 | Disposition: A | Discharge status: Home / Self Care | Payer: HRSA Program | Attending: Emergency Medicine | Admitting: Emergency Medicine

## 2019-10-04 ENCOUNTER — Emergency Department (HOSPITAL_COMMUNITY): Payer: HRSA Program

## 2019-10-04 ENCOUNTER — Encounter (HOSPITAL_COMMUNITY): Payer: Self-pay | Admitting: Emergency Medicine

## 2019-10-04 ENCOUNTER — Other Ambulatory Visit: Payer: Self-pay

## 2019-10-04 DIAGNOSIS — J069 Acute upper respiratory infection, unspecified: Secondary | ICD-10-CM

## 2019-10-04 DIAGNOSIS — R05 Cough: Secondary | ICD-10-CM | POA: Diagnosis present

## 2019-10-04 DIAGNOSIS — I10 Essential (primary) hypertension: Secondary | ICD-10-CM | POA: Diagnosis not present

## 2019-10-04 DIAGNOSIS — R0789 Other chest pain: Secondary | ICD-10-CM | POA: Diagnosis not present

## 2019-10-04 DIAGNOSIS — U071 COVID-19: Secondary | ICD-10-CM | POA: Insufficient documentation

## 2019-10-04 DIAGNOSIS — Z79899 Other long term (current) drug therapy: Secondary | ICD-10-CM | POA: Diagnosis not present

## 2019-10-04 LAB — CBC
HCT: 44.4 % (ref 36.0–46.0)
Hemoglobin: 14.7 g/dL (ref 12.0–15.0)
MCH: 29.8 pg (ref 26.0–34.0)
MCHC: 33.1 g/dL (ref 30.0–36.0)
MCV: 90.1 fL (ref 80.0–100.0)
Platelets: 225 10*3/uL (ref 150–400)
RBC: 4.93 MIL/uL (ref 3.87–5.11)
RDW: 13 % (ref 11.5–15.5)
WBC: 4.6 10*3/uL (ref 4.0–10.5)
nRBC: 0 % (ref 0.0–0.2)

## 2019-10-04 LAB — I-STAT BETA HCG BLOOD, ED (MC, WL, AP ONLY): I-stat hCG, quantitative: <5 m[IU]/mL (ref <5)

## 2019-10-04 MED ORDER — SODIUM CHLORIDE 0.9% FLUSH: 3.0000 mL | Freq: Once | INTRAVENOUS | Status: DC

## 2019-10-04 NOTE — ED Provider Notes (Signed)
[facility] EMERGENCY DEPARTMENT Provider Note   CSN: [id number] Arrival date & time: 10/04/19  2209     History Chief Complaint  Patient presents with  . Chest Pain  . Cough    Lisa [name] is a 49 y.o. female.  Patient is a 49 year old Hispanic female presenting with complaints of chest tightness, cough, and congestion for the past 3 days.  She describes a burning in the center of her chest that is worse when she coughs.  She coughs up phlegm that is yellow, but nonbloody.  She denies any difficulty breathing.  She denies any exertional symptoms. She has not taken her temperature, but has felt warm and has been taking Tylenol at home.  She denies any ill contacts.  The history is provided by the patient.  Chest Pain Pain location:  Substernal area Pain quality: burning   Pain radiates to:  Does not radiate Pain severity:  Mild Onset quality:  Gradual Duration:  3 days Timing:  Constant Progression:  Worsening Chronicity:  New Relieved by:  Nothing Worsened by:  Coughing Ineffective treatments:  None tried      Past Medical History:  Diagnosis Date  . Anxiety   . Arthritis   . Depression   . Hypertension   . Migraine     Patient Active Problem List   Diagnosis Date Noted  . Pain in both lower extremities 05/10/2017  . Elevated LDL cholesterol level 01/02/2017  . Menorrhagia with regular cycle 03/13/2014  . Back pain 02/28/2014  . Dizziness 02/28/2014  . Muscle strain 02/20/2014  . 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 Depressive disorder 02/01/2011  . Fatigue 11/22/2010  . OBESITY, NOS 08/31/2006  . MIGRAINE, UNSPEC., W/O INTRACTABLE MIGRAINE 08/31/2006  . HYPERTENSION, BENIGN SYSTEMIC 08/31/2006    History reviewed. No pertinent surgical history.   OB History   No obstetric history on file.     Family History  Problem Relation Age of Onset  . Diabetes Mother   . Hypertension Mother     Social History   Tobacco Use    . Smoking status: Never Smoker  . Smokeless tobacco: Never Used  Substance Use Topics  . Alcohol use: No  . Drug use: No    Home Medications Prior to Admission medications   Medication Sig Start Date End Date Taking? Authorizing Provider  docusate sodium (COLACE) 100 MG capsule Take 1 capsule (100 mg total) by mouth daily. 09/30/19 10/30/19  Claiborne Rigg, NP  losartan (COZAAR) 50 MG tablet Take 1 tablet (50 mg total) by mouth daily. 09/30/19   Claiborne Rigg, NP  pravastatin (PRAVACHOL) 10 MG tablet Take 1 tablet (10 mg total) by mouth daily. 09/30/19 12/29/19  Claiborne Rigg, NP    Allergies    Patient has no known allergies.  Review of Systems   Review of Systems  All other systems reviewed and are negative.   Physical Exam Updated Vital Signs BP (!) 151/82 (BP Location: Left Arm)   Pulse 72   Temp 98.2 F (36.8 C) (Oral)   Resp 18   LMP 02/03/2019 Comment: Irregular periods.  Went 1 yr without a period then had one 08/20  SpO2 99%   Physical Exam Vitals and nursing note reviewed.  Constitutional:      General: She is not in acute distress.    Appearance: She is well-developed. She is not diaphoretic.  HENT:     Head: Normocephalic and atraumatic.  Cardiovascular:  Rate and Rhythm: Normal rate and regular rhythm.     Heart sounds: No murmur. No friction rub. No gallop.   Pulmonary:     Effort: Pulmonary effort is normal. No respiratory distress.     Breath sounds: Normal breath sounds. No wheezing.  Abdominal:     General: Bowel sounds are normal. There is no distension.     Palpations: Abdomen is soft.     Tenderness: There is no abdominal tenderness.  Musculoskeletal:        General: Normal range of motion.     Cervical back: Normal range of motion and neck supple.     Right lower leg: No tenderness. No edema.     Left lower leg: No tenderness. No edema.     Comments: Bevelyn Buckles' sign is absent bilaterally.  Skin:    General: Skin is warm and dry.   Neurological:     Mental Status: She is alert and oriented to person, place, and time.     ED Results / Procedures / Treatments   Labs (all labs ordered are listed, but only abnormal results are displayed) Labs Reviewed  SARS CORONAVIRUS 2 (TAT 6-24 HRS)  CBC  BASIC METABOLIC PANEL  I-STAT BETA HCG BLOOD, ED ([facility], [facility], [facility] ONLY)  TROPONIN I (HIGH SENSITIVITY)    EKG EKG Interpretation  Date/Time:  Friday October 04 2019 22:18:08 EDT Ventricular Rate:  89 PR Interval:  136 QRS Duration: 80 QT Interval:  346 QTC Calculation: 420 R Axis:   88 Text Interpretation: Normal sinus rhythm Normal ECG No significant change since 10/13/2016 Confirmed by Veryl Speak [phone]) on 10/04/2019 10:59:22 PM   Radiology DG Chest 2 View  Result Date: 10/04/2019 CLINICAL DATA:  Chest pain and cough EXAM: CHEST - 2 VIEW COMPARISON:  None. FINDINGS: The heart size and mediastinal contours are within normal limits. Both lungs are clear. The visualized skeletal structures are unremarkable. IMPRESSION: No active cardiopulmonary disease. Electronically Signed   By: Inez Catalina M.D.   On: 10/04/2019 23:11    Procedures Procedures (including critical care time)  Medications Ordered in ED Medications  sodium chloride flush (NS) 0.9 % injection 3 mL (has no administration in time range)    ED Course  I have reviewed the triage vital signs and the nursing notes.  Pertinent labs & imaging results that were available during my care of the patient were reviewed by me and considered in my medical decision making (see chart for details).    MDM Rules/Calculators/A&P  Patient presenting here with complaints of chest congestion and cough for the past several days.  Patient's presentation and results of work-up are most consistent with a URI.  Her EKG is showing no acute changes and her troponin is negative.  Chest x-ray shows no evidence for pneumonia or other abnormality.  A Covid test has been obtained and  will be sent out.  The results should be known in the next 6 to 24 hours.  Patient advised to stay home until the results of this are known.  I have also advised her to take over-the-counter medications as needed for symptom relief.  I see no indication for antibiotics at this time as her symptoms have only been present for the past 3 days and there is no evidence of pneumonia on her chest x-ray.  Final Clinical Impression(s) / ED Diagnoses Final diagnoses:  None    Rx / DC Orders ED Discharge Orders    None  Geoffery Lyons, MD 10/05/19 [phone]

## 2019-10-04 NOTE — ED Triage Notes (Signed)
  Patient comes in with chest pain and cough that has been going on for about 3 days.  Has a burning pain in the middle of her chest and it hurts when she coughs.  Has been taking tylenol at home, last dose 500mg  at 1800.  No sick contacts.  States she feels tired and was unable to go to work today.  No pain.

## 2019-10-05 LAB — TROPONIN I (HIGH SENSITIVITY): Troponin I (High Sensitivity): 3 ng/L (ref <18)

## 2019-10-05 LAB — BASIC METABOLIC PANEL
Anion gap: 9 (ref 5–15)
BUN: 14 mg/dL (ref 6–20)
CO2: 26 mmol/L (ref 22–32)
Calcium: 8.9 mg/dL (ref 8.9–10.3)
Chloride: 104 mmol/L (ref 98–111)
Creatinine, Ser: 0.75 mg/dL (ref 0.44–1.00)
GFR calc Af Amer: >60 mL/min (ref >60)
GFR calc non Af Amer: >60 mL/min (ref >60)
Glucose, Bld: 109 mg/dL — ABNORMAL HIGH (ref 70–99)
Potassium: 4.1 mmol/L (ref 3.5–5.1)
Sodium: 139 mmol/L (ref 135–145)

## 2019-10-05 LAB — SARS CORONAVIRUS 2 (TAT 6-24 HRS): SARS Coronavirus 2: POSITIVE — AB

## 2019-10-05 NOTE — ED Notes (Signed)
Pt alert no distress 

## 2019-10-05 NOTE — Discharge Instructions (Addendum)
Take over-the-counter cough medication/decongestant as needed for relief of symptoms.  Isolate at home until the results of your Covid test are known.  This should be in the next 6 to 24 hours.  Return to the emergency department if you develop worsening pain, difficulty breathing, or other new and concerning symptoms.

## 2019-10-06 ENCOUNTER — Telehealth (HOSPITAL_COMMUNITY): Payer: Self-pay | Admitting: Nurse Practitioner

## 2019-10-06 ENCOUNTER — Other Ambulatory Visit (HOSPITAL_COMMUNITY): Payer: Self-pay | Admitting: Nurse Practitioner

## 2019-10-06 DIAGNOSIS — U071 COVID-19: Secondary | ICD-10-CM

## 2019-10-06 MED ORDER — SODIUM CHLORIDE 0.9 % IV SOLN
700.0000 mg | Freq: Once | INTRAVENOUS | Status: AC
  Administered 2019-10-07: 700 mg via INTRAVENOUS
  Filled 2019-10-06: qty 700

## 2019-10-06 MED ORDER — BENZONATATE 100 MG PO CAPS: 200.0000 mg | ORAL_CAPSULE | Freq: Three times a day (TID) | ORAL | 0 refills | Status: DC | PRN

## 2019-10-06 MED ORDER — DM-GUAIFENESIN ER 30-600 MG PO TB12: 2.0000 | ORAL_TABLET | Freq: Two times a day (BID) | ORAL | 1 refills | Status: DC | PRN

## 2019-10-06 NOTE — Telephone Encounter (Signed)
Called to Discuss with patient about Covid symptoms and the use of bamlanivimab, a monoclonal antibody infusion for those with mild to moderate Covid symptoms and at a high risk of hospitalization.     Pt is qualified for this infusion at the [facility] infusion center due to co-morbid conditions and/or a member of an at-risk group.     With assistance of Annabelle, from [employer], I called patient. Patient complains of phlegm and chest congestion. She has not tried otc medications as she is unsure of what to take. I will send prescriptions for tessalon and mucinex dm to [facility]. Indications and directions discussed in detail through interpreter. Patient also qualifies for MAB treatment based on BMI. Onset of symptoms was Wednesday. See orders encounter for further details.   Consuello Masse, DNP, AGNP-C [phone] (Infusion Center Hotline)

## 2019-10-06 NOTE — Progress Notes (Signed)
I connected by phone with [name] on 10/06/2019 at 9:30 AM to discuss the potential use of an new treatment for mild to moderate COVID-19 viral infection in non-hospitalized patients.  This patient is a 49 y.o. female that meets the FDA criteria for Emergency Use Authorization of bamlanivimab or casirivimab\imdevimab.  Has a (+) direct SARS-CoV-2 viral test result  Has mild or moderate COVID-19   Is ? 49 years of age and weighs ? 40 kg  Is NOT hospitalized due to COVID-19  Is NOT requiring oxygen therapy or requiring an increase in baseline oxygen flow rate due to COVID-19  Is within 10 days of symptom onset  Has at least one of the high risk factor(s) for progression to severe COVID-19 and/or hospitalization as defined in EUA.  Specific high risk criteria : BMI >/= 35  I have spoken and communicated the following to the patient or parent/caregiver, through [employer], [employer], due to language barrier:   1. FDA has authorized the emergency use of bamlanivimab and casirivimab\imdevimab for the treatment of mild to moderate COVID-19 in adults and pediatric patients with positive results of direct SARS-CoV-2 viral testing who are 61 years of age and older weighing at least 40 kg, and who are at high risk for progressing to severe COVID-19 and/or hospitalization.  2. The significant known and potential risks and benefits of bamlanivimab and casirivimab\imdevimab, and the extent to which such potential risks and benefits are unknown.  3. Information on available alternative treatments and the risks and benefits of those alternatives, including clinical trials.  4. Patients treated with bamlanivimab and casirivimab\imdevimab should continue to self-isolate and use infection control measures (e.g., wear mask, isolate, social distance, avoid sharing personal items, clean and disinfect "high touch" surfaces, and frequent handwashing) according to CDC guidelines.    5. The patient or parent/caregiver has the option to accept or refuse bamlanivimab or casirivimab\imdevimab .  After reviewing this information with the patient, The patient agreed to proceed with receiving the bamlanimivab infusion and will be provided a copy of the Fact sheet prior to receiving the infusion.Consuello Masse, DNP, AGNP-C [phone] (Infusion Center Hotline)

## 2019-10-07 ENCOUNTER — Ambulatory Visit (HOSPITAL_COMMUNITY)
Admission: RE | Admit: 2019-10-07 | Discharge: 2019-10-07 | Disposition: A | Discharge status: Home / Self Care | Payer: HRSA Program | Source: Ambulatory Visit | Attending: Pulmonary Disease | Admitting: Pulmonary Disease

## 2019-10-07 DIAGNOSIS — U071 COVID-19: Secondary | ICD-10-CM | POA: Insufficient documentation

## 2019-10-07 MED ORDER — DIPHENHYDRAMINE HCL 50 MG/ML IJ SOLN: 50.0000 mg | Freq: Once | INTRAMUSCULAR | Status: DC | PRN

## 2019-10-07 MED ORDER — FAMOTIDINE IN NACL 20-0.9 MG/50ML-% IV SOLN: 20.0000 mg | Freq: Once | INTRAVENOUS | Status: DC | PRN

## 2019-10-07 MED ORDER — SODIUM CHLORIDE 0.9 % IV SOLN: INTRAVENOUS | Status: DC | PRN

## 2019-10-07 MED ORDER — METHYLPREDNISOLONE SODIUM SUCC 125 MG IJ SOLR: 125.0000 mg | Freq: Once | INTRAMUSCULAR | Status: DC | PRN

## 2019-10-07 MED ORDER — EPINEPHRINE 0.3 MG/0.3ML IJ SOAJ: 0.3000 mg | Freq: Once | INTRAMUSCULAR | Status: DC | PRN

## 2019-10-07 MED ORDER — ALBUTEROL SULFATE HFA 108 (90 BASE) MCG/ACT IN AERS: 2.0000 | INHALATION_SPRAY | Freq: Once | RESPIRATORY_TRACT | Status: DC | PRN

## 2019-10-07 NOTE — Discharge Instructions (Signed)
COVID-19: Cmo protegerse y proteger a los dems COVID-19: How to Protect Yourself and Others Sepa cmo se propaga  Actualmente, no existe ninguna vacuna para prevenir la enfermedad por coronavirus 2019 (COVID-19).  La mejor forma de prevenir la enfermedad es evitar exponerse a este virus.  Se cree que el virus se transmite principalmente de una persona a otra. ? Entre las personas que estn en contacto directo entre s (a una distancia inferior a 6 pies [1.80m]). ? A travs de las gotitas respiratorias producidas cuando una persona infectada tose, estornuda o habla. ? Estas gotitas pueden caer en la boca o en la nariz de las personas que estn cerca o pueden ser inhaladas hacia los pulmones. ? El COVID-19 puede ser transmitido por personas que no presentan sntomas. Lo que todos deben hacer Lmpiese las manos con frecuencia  Lvese las manos con frecuencia con agua y jabn durante al menos 20 segundos, especialmente despus de haber estado en un lugar pblico o despus de sonarse la nariz, toser o estornudar.  Si no dispone de agua y jabn, use un desinfectante de manos que contenga al menos un 60% de alcohol. Cubra todas las superficies de las manos y frtelas hasta que se sientan secas.  No se toque los ojos, la nariz y la boca sin antes lavarse las manos. Evite el contacto cercano  Limite el contacto directo con otras personas tanto como sea posible.  Evite el contacto cercano con personas que estn enfermas.  Establezca distancia entre usted y otras personas. ? Recuerde que algunas personas que no tienen sntomas pueden transmitir el virus. ? Esto es especialmente importante para las personas que tienen ms riesgo de enfermarse.www.cdc.gov/coronavirus/2019-ncov/need-extra-precautions/people-at-higher-risk.html Cbrase la boca y la nariz con una mascarilla cuando est cerca de otras personas  Puede transmitir el COVID-19 a otras personas aunque no se sienta enfermo.  Todas las  personas deben usar mascarilla en lugares pblicos y cuando estn con otras que no vivan en su casa, especialmente cuando el distanciamiento social sea difcil de mantener. ? Las mascarillas no deben colocarse a nios menores de 2 aos de edad, a las personas que tienen problemas respiratorios o que estn inconscientes, incapacitadas o que por algn motivo no puedan quitarse la mascarilla sin ayuda.  El propsito de la mascarilla es proteger a otras personas en caso de que usted est infectado.  NO utilice las mascarillas destinadas a los trabajadores de la salud.  Contine manteniendo una distancia aproximada de 6 pies (1.80m) entre usted y otras personas. La mascarilla no reemplaza el distanciamiento social. Cbrase al toser y estornudar  Al toser o al estornudar, siempre cbrase la boca y la nariz con un pauelo descartable o use el interior del codo.  Deseche los pauelos descartables usados en la basura.  Inmediatamente, lvese las manos con agua y jabn durante al menos 20segundos. Si no dispone de agua y jabn, lmpiese las manos con un desinfectante de manos que contenga al menos un 60% de alcohol. Limpie y desinfecte  Limpie Y desinfecte las superficies que se tocan con frecuencia todos los das. Esto incluye mesas, picaportes, interruptores de luz, encimeras, mangos, escritorios, telfonos, teclados, inodoros, grifos y lavabos. www.cdc.gov/coronavirus/2019-ncov/prevent-getting-sick/disinfecting-your-home.html  Si las superficies estn sucias, lmpielas: Use detergente o jabn y agua antes de la desinfeccin.  Luego, use un desinfectante domstico. Puede consultar una lista de los desinfectantes domsticos registrados en la Environmental Protection Agency (EPA) (Agencia de Proteccin Ambiental) aqu. cdc.gov/coronavirus 03/06/2019 Esta informacin no tiene como fin reemplazar el consejo del mdico.   Asegrese de hacerle al mdico cualquier pregunta que tenga. Document Revised:  03/20/2019 Document Reviewed: 01/11/2019 Elsevier Patient Education  2020 Elsevier Inc. What types of side effects do monoclonal antibody drugs cause?  Common side effects  In general, the more common side effects caused by monoclonal antibody drugs include: . Allergic reactions, such as hives or itching . Flu-like signs and symptoms, including chills, fatigue, fever, and muscle aches and pains . Nausea, vomiting . Diarrhea . Skin rashes . Low blood pressure   The CDC is recommending patients who receive monoclonal antibody treatments wait at least 90 days before being vaccinated.  Currently, there are no data on the safety and efficacy of mRNA COVID-19 vaccines in persons who received monoclonal antibodies or convalescent plasma as part of COVID-19 treatment. Based on the estimated half-life of such therapies as well as evidence suggesting that reinfection is uncommon in the 90 days after initial infection, vaccination should be deferred for at least 90 days, as a precautionary measure until additional information becomes available, to avoid interference of the antibody treatment with vaccine-induced immune responses. 

## 2019-10-07 NOTE — Progress Notes (Signed)
  Diagnosis: COVID-19  Physician: Dr. Wright  Procedure: Covid Infusion Clinic Med: bamlanivimab infusion - Provided patient with bamlanimivab fact sheet for patients, parents and caregivers prior to infusion.  Complications: No immediate complications noted.  Discharge: Discharged home   Camika Marsico S Aleecia Tapia 10/07/2019  

## 2019-10-14 ENCOUNTER — Other Ambulatory Visit: Payer: Self-pay | Admitting: Nurse Practitioner

## 2019-10-14 MED ORDER — METRONIDAZOLE 500 MG PO TABS: 500.0000 mg | ORAL_TABLET | Freq: Two times a day (BID) | ORAL | 0 refills | Status: AC

## 2019-10-22 ENCOUNTER — Other Ambulatory Visit: Payer: Self-pay

## 2019-10-22 ENCOUNTER — Ambulatory Visit: Payer: Self-pay | Attending: Nurse Practitioner | Admitting: Nurse Practitioner

## 2019-11-19 ENCOUNTER — Other Ambulatory Visit: Payer: Self-pay | Admitting: Nurse Practitioner

## 2019-11-19 ENCOUNTER — Ambulatory Visit: Payer: Self-pay | Attending: Nurse Practitioner | Admitting: Nurse Practitioner

## 2019-11-19 ENCOUNTER — Encounter: Payer: Self-pay | Admitting: Nurse Practitioner

## 2019-11-19 ENCOUNTER — Other Ambulatory Visit: Payer: Self-pay

## 2019-11-19 VITALS — BP 138/83 | HR 85 | Temp 97.7°F | Ht 60.0 in | Wt 168.8 lb

## 2019-11-19 DIAGNOSIS — I10 Essential (primary) hypertension: Secondary | ICD-10-CM

## 2019-11-19 DIAGNOSIS — E782 Mixed hyperlipidemia: Secondary | ICD-10-CM

## 2019-11-19 DIAGNOSIS — R519 Headache, unspecified: Secondary | ICD-10-CM

## 2019-11-19 MED ORDER — PRAVASTATIN SODIUM 10 MG PO TABS: 10.0000 mg | ORAL_TABLET | Freq: Every day | ORAL | 1 refills | Status: DC

## 2019-11-19 MED ORDER — VALACYCLOVIR HCL 500 MG PO TABS: 500.0000 mg | ORAL_TABLET | Freq: Every day | ORAL | 1 refills | Status: DC

## 2019-11-19 MED ORDER — FLUTICASONE PROPIONATE 50 MCG/ACT NA SUSP: 2.0000 | Freq: Every day | NASAL | 6 refills | Status: DC

## 2019-11-19 MED ORDER — LOSARTAN POTASSIUM 50 MG PO TABS: 50.0000 mg | ORAL_TABLET | Freq: Every day | ORAL | 3 refills | Status: DC

## 2019-11-19 NOTE — Progress Notes (Signed)
Assessment & Plan:  Lisa [name] was seen today for blood pressure check.  Diagnoses and all orders for this visit:  Essential hypertension -     losartan (COZAAR) 50 MG tablet; Take 1 tablet (50 mg total) by mouth daily. Continue all antihypertensives as prescribed.  Remember to bring in your blood pressure log with you for your follow up appointment.  DASH/Mediterranean Diets are healthier choices for HTN.   Facial pain -     DG Sinuses Complete; Future -     fluticasone (FLONASE) 50 MCG/ACT nasal spray; Place 2 sprays into both nostrils daily. -     valACYclovir (VALTREX) 500 MG tablet; Take 1 tablet (500 mg total) by mouth daily.  Mixed hyperlipidemia -     pravastatin (PRAVACHOL) 10 MG tablet; Take 1 tablet (10 mg total) by mouth daily.    Patient has been counseled on age-appropriate routine health concerns for screening and prevention. These are reviewed and up-to-date. Referrals have been placed accordingly. Immunizations are up-to-date or declined.    Subjective:   Chief Complaint  Patient presents with  . Blood Pressure Check    Pt. is here for blood pressure check. Pt. stated she's been out of her BP medication for 2 days, just called in for refill. Pt. stated her left side of her head, cheeks, and eyes feel numb, it comes and go.     HPI Lisa [name] 49 y.o. female presents to office today for HTN VRI was used to communicate directly with patient for the entire encounter including providing detailed patient instructions.    Essential Hypertension Well controlled. She is currently out of her medication Losartan 50 mg daily for the past few days. Does not monitor blood pressure at home. Denies chest pain, shortness of breath, palpitations, lightheadedness, dizziness, headaches or BLE edema.    BP Readings from Last 3 Encounters:  11/19/19 138/83  10/07/19 113/67  10/05/19 126/83   Dyslipidemia LDL not at goal of <100. Will refill pravachol 10 mg daily. Denies statin  intolerance or myalgias.  Lab Results  Component Value Date   LDLCALC 108 (H) 09/30/2019   Paresthesias Patient complains of bilateral ear pressure/pain, achiness and intermittent left sided parietal headache described as numbness and tingling. Symptoms include numbess in left cheek and around left eye Onset of symptoms was 2 weeks ago.  She is drinking plenty of fluidsPast history is significant for migraines. Patient is non-smoker.  Diagnosed with COVID march 30th.    Review of Systems  Constitutional: Negative for fever, malaise/fatigue and weight loss.  HENT: Negative.  Negative for nosebleeds.   Eyes: Negative.  Negative for blurred vision, double vision and photophobia.  Respiratory: Negative.  Negative for cough and shortness of breath.   Cardiovascular: Negative.  Negative for chest pain, palpitations and leg swelling.  Gastrointestinal: Negative.  Negative for heartburn, nausea and vomiting.  Musculoskeletal: Negative.  Negative for myalgias.  Neurological: Positive for tingling, sensory change and headaches. Negative for dizziness, focal weakness and seizures.  Psychiatric/Behavioral: Negative.  Negative for suicidal ideas.    Past Medical History:  Diagnosis Date  . Anxiety   . Arthritis   . Depression   . Hypertension   . Migraine     History reviewed. No pertinent surgical history.  Family History  Problem Relation Age of Onset  . Diabetes Mother   . Hypertension Mother     Social History Reviewed with no changes to be made today.   Outpatient Medications Prior to Visit  Medication Sig Dispense Refill  . losartan (COZAAR) 50 MG tablet Take 1 tablet (50 mg total) by mouth daily. 90 tablet 3  . pravastatin (PRAVACHOL) 10 MG tablet Take 1 tablet (10 mg total) by mouth daily. 90 tablet 1  . valACYclovir (VALTREX) 500 MG tablet Take 500 mg by mouth daily.    . benzonatate (TESSALON) 100 MG capsule Take 2 capsules (200 mg total) by mouth 3 (three) times daily as  needed for cough. (Patient not taking: Reported on 11/19/2019) 60 capsule 0  . dextromethorphan-guaiFENesin (MUCINEX DM) 30-600 MG 12hr tablet Take 2 tablets by mouth 2 (two) times daily as needed for cough (and congestion). (Patient not taking: Reported on 11/19/2019) 20 tablet 1   No facility-administered medications prior to visit.    No Known Allergies     Objective:    BP 138/83 (BP Location: Left Arm, Patient Position: Sitting, Cuff Size: Normal)   Pulse 85   Temp 97.7 F (36.5 C) (Temporal)   Ht 5' (1.524 m)   Wt 168 lb 12.8 oz (76.6 kg)   SpO2 99%   BMI 32.97 kg/m  Wt Readings from Last 3 Encounters:  11/19/19 168 lb 12.8 oz (76.6 kg)  09/30/19 181 lb (82.1 kg)  07/24/18 179 lb (81.2 kg)    Physical Exam Vitals and nursing note reviewed.  Constitutional:      Appearance: She is well-developed.  HENT:     Head: Normocephalic and atraumatic.  Cardiovascular:     Rate and Rhythm: Normal rate and regular rhythm.     Heart sounds: Normal heart sounds. No murmur. No friction rub. No gallop.   Pulmonary:     Effort: Pulmonary effort is normal. No tachypnea or respiratory distress.     Breath sounds: Normal breath sounds. No decreased breath sounds, wheezing, rhonchi or rales.  Chest:     Chest wall: No tenderness.  Abdominal:     General: Bowel sounds are normal.     Palpations: Abdomen is soft.  Musculoskeletal:        General: Normal range of motion.     Cervical back: Normal range of motion.  Skin:    General: Skin is warm and dry.  Neurological:     Mental Status: She is alert and oriented to person, place, and time.     Cranial Nerves: No dysarthria or facial asymmetry.     Sensory: Sensation is intact. No sensory deficit.     Motor: No weakness, tremor or abnormal muscle tone.     Coordination: Coordination is intact. Coordination normal.     Gait: Gait is intact.  Psychiatric:        Behavior: Behavior normal. Behavior is cooperative.        Thought  Content: Thought content normal.        Judgment: Judgment normal.        Patient has been counseled extensively about nutrition and exercise as well as the importance of adherence with medications and regular follow-up. The patient was given clear instructions to go to ER or return to medical center if symptoms don't improve, worsen or new problems develop. The patient verbalized understanding.   Follow-up: Return in about 6 weeks (around 12/31/2019) for facial pain/numbness.   Gildardo Pounds, FNP-BC [facility] [place], [place]   11/19/2019, 8:39 PM

## 2019-12-13 ENCOUNTER — Other Ambulatory Visit: Payer: Self-pay

## 2019-12-13 ENCOUNTER — Ambulatory Visit (HOSPITAL_COMMUNITY)
Admission: RE | Admit: 2019-12-13 | Discharge: 2019-12-13 | Disposition: A | Discharge status: Home / Self Care | Payer: Self-pay | Source: Ambulatory Visit | Attending: Nurse Practitioner | Admitting: Nurse Practitioner

## 2019-12-13 DIAGNOSIS — R519 Headache, unspecified: Secondary | ICD-10-CM | POA: Insufficient documentation

## 2020-01-01 ENCOUNTER — Encounter: Payer: Self-pay | Admitting: Nurse Practitioner

## 2020-01-01 ENCOUNTER — Ambulatory Visit: Payer: Self-pay | Attending: Nurse Practitioner | Admitting: Nurse Practitioner

## 2020-01-01 DIAGNOSIS — G43009 Migraine without aura, not intractable, without status migrainosus: Secondary | ICD-10-CM

## 2020-01-01 MED ORDER — TOPIRAMATE 25 MG PO TABS: 25.0000 mg | ORAL_TABLET | Freq: Two times a day (BID) | ORAL | 1 refills | Status: DC

## 2020-01-01 NOTE — Progress Notes (Signed)
Virtual Visit via Telephone Note Due to national recommendations of social distancing due to COVID 19, telehealth visit is felt to be most appropriate for this patient at this time.  I discussed the limitations, risks, security and privacy concerns of performing an evaluation and management service by telephone and the availability of in person appointments. I also discussed with the patient that there may be a patient responsible charge related to this service. The patient expressed understanding and agreed to proceed.    I connected with Lisa [name] on 01/01/20  at   8:30 AM EDT  EDT by telephone and verified that I am speaking with the correct person using two identifiers.   Consent I discussed the limitations, risks, security and privacy concerns of performing an evaluation and management service by telephone and the availability of in person appointments. I also discussed with the patient that there may be a patient responsible charge related to this service. The patient expressed understanding and agreed to proceed.   Location of Patient: Private Residence    [facility] Provider[facility]    Persons participating in Telemedicine visit: Lisa [name] Lisa [name] Lisa [name]  [id number] Spanish Interpreter   History of Present Illness: Telemedicine visit for: F/U Facial pain and headache with numbness and tingling.  This is a follow-up from an office visit on 11/19/2019.  At that time patient had complaints of  bilateral ear pressure/pain, achiness and intermittent left sided parietal headache described as numbness and tingling. Symptoms include numbess in left cheek and around left eye Onset of symptoms was 2 weeks  prior.  Past history is significant for migraines. Patient is non-smoker.  Diagnosed with COVID march 30th.  X-ray of sinuses was performed which was negative for any sinusitis.  She is also prescribed Flonase.  Today she reports  headaches are still persistent.  She has a history of migraines will treat with Topamax today and have her follow-up in 2 weeks.  She denies any weakness, visual disturbances, dysarthria or hemiparesis. BP Readings from Last 3 Encounters:  11/19/19 138/83  10/07/19 113/67  10/05/19 126/83    Past Medical History:  Diagnosis Date  . Anxiety   . Arthritis   . Depression   . Hypertension   . Migraine     History reviewed. No pertinent surgical history.  Family History  Problem Relation Age of Onset  . Diabetes Mother   . Hypertension Mother     Social History   Socioeconomic History  . Marital status: Single    Spouse name: Not on file  . Number of children: Not on file  . Years of education: Not on file  . Highest education level: Not on file  Occupational History  . Not on file  Tobacco Use  . Smoking status: Never Smoker  . Smokeless tobacco: Never Used  Substance and Sexual Activity  . Alcohol use: No  . Drug use: No  . Sexual activity: Not on file  Other Topics Concern  . Not on file  Social History Narrative   Lives with 3 children (2 sons-Lisa [name] and Lisa [name], 1 daughter Lisa [name]). Employed   Social Determinants of Corporate investment banker Strain:   . Difficulty of Paying Living Expenses:   Food Insecurity:   . Worried About Programme researcher, broadcasting/film/video in the Last Year:   . Barista in the Last Year:   Transportation Needs:   . Lack of Transportation (  Medical):   Marland Kitchen Lack of Transportation (Non-Medical):   Physical Activity:   . Days of Exercise per Week:   . Minutes of Exercise per Session:   Stress:   . Feeling of Stress :   Social Connections:   . Frequency of Communication with Friends and Family:   . Frequency of Social Gatherings with Friends and Family:   . Attends Religious Services:   . Active Member of Clubs or Organizations:   . Attends Banker Meetings:   Marland Kitchen Marital Status:      Observations/Objective: Awake,  alert and oriented x 3   Review of Systems  Constitutional: Negative for fever, malaise/fatigue and weight loss.  HENT: Negative.  Negative for nosebleeds.   Eyes: Negative.  Negative for blurred vision, double vision and photophobia.  Respiratory: Negative.  Negative for cough and shortness of breath.   Cardiovascular: Negative.  Negative for chest pain, palpitations and leg swelling.  Gastrointestinal: Negative.  Negative for heartburn, nausea and vomiting.  Musculoskeletal: Negative.  Negative for myalgias.  Neurological: Positive for headaches. Negative for dizziness, focal weakness and seizures.  Psychiatric/Behavioral: Negative.  Negative for suicidal ideas.    Assessment and Plan: Lisa [name] was seen today for follow-up.  Diagnoses and all orders for this visit:  Migraine without aura and without status migrainosus, not intractable -     topiramate (TOPAMAX) 25 MG tablet; Take 1 tablet (25 mg total) by mouth 2 (two) times daily. Avoid smoking and caffeine products   Follow Up Instructions Return in about 2 weeks (around 01/15/2020).     I discussed the assessment and treatment plan with the patient. The patient was provided an opportunity to ask questions and all were answered. The patient agreed with the plan and demonstrated an understanding of the instructions.   The patient was advised to call back or seek an in-person evaluation if the symptoms worsen or if the condition fails to improve as anticipated.  I provided 13 minutes of non-face-to-face time during this encounter including median intraservice time, reviewing previous notes, labs, imaging, medications and explaining diagnosis and management.  Claiborne Rigg, [name]

## 2020-01-13 ENCOUNTER — Encounter: Payer: Self-pay | Admitting: Nurse Practitioner

## 2020-01-13 ENCOUNTER — Other Ambulatory Visit: Payer: Self-pay

## 2020-01-13 ENCOUNTER — Ambulatory Visit: Payer: Self-pay | Attending: Nurse Practitioner | Admitting: Nurse Practitioner

## 2020-01-13 DIAGNOSIS — G4489 Other headache syndrome: Secondary | ICD-10-CM

## 2020-01-13 DIAGNOSIS — R200 Anesthesia of skin: Secondary | ICD-10-CM

## 2020-01-13 MED ORDER — GABAPENTIN 300 MG PO CAPS: 300.0000 mg | ORAL_CAPSULE | Freq: Three times a day (TID) | ORAL | 1 refills | Status: DC

## 2020-01-13 NOTE — Progress Notes (Signed)
Virtual Visit via Telephone Note Due to national recommendations of social distancing due to COVID 19, telehealth visit is felt to be most appropriate for this patient at this time.  I discussed the limitations, risks, security and privacy concerns of performing an evaluation and management service by telephone and the availability of in person appointments. I also discussed with the patient that there may be a patient responsible charge related to this service. The patient expressed understanding and agreed to proceed.    I connected with Lisa [name] on 01/13/20  at  10:50 AM EDT  EDT by telephone and verified that I am speaking with the correct person using two identifiers.   Consent I discussed the limitations, risks, security and privacy concerns of performing an evaluation and management service by telephone and the availability of in person appointments. I also discussed with the patient that there may be a patient responsible charge related to this service. The patient expressed understanding and agreed to proceed.   Location of Patient: Private Residence   [facility] Provider[facility]    Persons participating in Telemedicine visit: [name] FNP-BC [place] [place] CMA [name] Macey Wurtz Spanish Interpreter ID# [id number]   History of Present Illness: Telemedicine visit for: Headaches and chronic paresthesia of the left side of face. Will start on gabapentin. Topirimate and valtrex ineffective. She does have a history of migraines. Feels facial paresthesia has been present since she was diagnosed with COVID several months ago.  Denies any weakness or stroke like symptoms.    Past Medical History:  Diagnosis Date   Anxiety    Arthritis    Depression    Hypertension    Migraine     History reviewed. No pertinent surgical history.  Family History  Problem Relation Age of Onset   Diabetes Mother    Hypertension Mother     Social  History   Socioeconomic History   Marital status: Single    Spouse name: Not on file   Number of children: Not on file   Years of education: Not on file   Highest education level: Not on file  Occupational History   Not on file  Tobacco Use   Smoking status: Never Smoker   Smokeless tobacco: Never Used  Substance and Sexual Activity   Alcohol use: No   Drug use: No   Sexual activity: Not on file  Other Topics Concern   Not on file  Social History Narrative   Lives with 3 children (2 sons-[name] and [name], 1 daughter Justine Null). Employed   Social Determinants of Corporate investment banker Strain:    Difficulty of Paying Living Expenses:   Food Insecurity:    Worried About Programme researcher, broadcasting/film/video in the Last Year:    Barista in the Last Year:   Transportation Needs:    Freight forwarder (Medical):    Lack of Transportation (Non-Medical):   Physical Activity:    Days of Exercise per Week:    Minutes of Exercise per Session:   Stress:    Feeling of Stress :   Social Connections:    Frequency of Communication with Friends and Family:    Frequency of Social Gatherings with Friends and Family:    Attends Religious Services:    Active Member of Clubs or Organizations:    Attends Banker Meetings:    Marital Status:      Observations/Objective: Awake, alert and  oriented x 3   Review of Systems  Constitutional: Negative for fever, malaise/fatigue and weight loss.  HENT: Negative.  Negative for nosebleeds.   Eyes: Negative.  Negative for blurred vision, double vision and photophobia.  Respiratory: Negative.  Negative for cough and shortness of breath.   Cardiovascular: Negative.  Negative for chest pain, palpitations and leg swelling.  Gastrointestinal: Negative.  Negative for heartburn, nausea and vomiting.  Musculoskeletal: Negative.  Negative for myalgias.  Neurological: Positive for tingling and sensory  change. Negative for dizziness, focal weakness, seizures and headaches.  Psychiatric/Behavioral: Negative.  Negative for suicidal ideas.    Assessment and Plan: [name] was seen today for follow-up.  Diagnoses and all orders for this visit:  Left facial numbness -     gabapentin (NEURONTIN) 300 MG capsule; Take 1 capsule (300 mg total) by mouth 3 (three) times daily. -     Ambulatory referral to Neurology -     Vitamin B12 -     TSH -     MR Brain W Wo Contrast; Future -     Sedimentation Rate -     C-reactive protein  Other headache syndrome -     Vitamin B12 -     TSH -     Sedimentation Rate -     C-reactive protein     Follow Up Instructions No follow-ups on file.     I discussed the assessment and treatment plan with the patient. The patient was provided an opportunity to ask questions and all were answered. The patient agreed with the plan and demonstrated an understanding of the instructions.   The patient was advised to call back or seek an in-person evaluation if the symptoms worsen or if the condition fails to improve as anticipated.  I provided 18 minutes of non-face-to-face time during this encounter including median intraservice time, reviewing previous notes, labs, imaging, medications and explaining diagnosis and management.  Claiborne Rigg, FNP-BC

## 2020-01-14 LAB — VITAMIN B12: Vitamin B-12: 559 pg/mL (ref 232–1245)

## 2020-01-14 LAB — TSH: TSH: 0.819 u[IU]/mL (ref 0.450–4.500)

## 2020-01-14 LAB — C-REACTIVE PROTEIN: CRP: <1 mg/L (ref 0–10)

## 2020-01-14 LAB — SEDIMENTATION RATE: Sed Rate: 27 mm/hr (ref 0–32)

## 2020-01-15 ENCOUNTER — Encounter: Payer: Self-pay | Admitting: Nurse Practitioner

## 2020-02-14 ENCOUNTER — Ambulatory Visit: Payer: Self-pay | Attending: Nurse Practitioner | Admitting: Nurse Practitioner

## 2020-02-14 ENCOUNTER — Encounter: Payer: Self-pay | Admitting: Nurse Practitioner

## 2020-02-14 DIAGNOSIS — I10 Essential (primary) hypertension: Secondary | ICD-10-CM

## 2020-02-14 DIAGNOSIS — R200 Anesthesia of skin: Secondary | ICD-10-CM

## 2020-02-14 MED ORDER — GABAPENTIN 300 MG PO CAPS: 300.0000 mg | ORAL_CAPSULE | Freq: Three times a day (TID) | ORAL | 1 refills | Status: DC

## 2020-02-14 NOTE — Progress Notes (Signed)
Virtual Visit via Telephone Note Due to national recommendations of social distancing due to COVID 19, telehealth visit is felt to be most appropriate for this patient at this time.  I discussed the limitations, risks, security and privacy concerns of performing an evaluation and management service by telephone and the availability of in person appointments. I also discussed with the patient that there may be a patient responsible charge related to this service. The patient expressed understanding and agreed to proceed.    I connected with Lisa [name] on 02/14/20  at  11:10 AM EDT  EDT by telephone and verified that I am speaking with the correct person using two identifiers.   Consent I discussed the limitations, risks, security and privacy concerns of performing an evaluation and management service by telephone and the availability of in person appointments. I also discussed with the patient that there may be a patient responsible charge related to this service. The patient expressed understanding and agreed to proceed.   Location of Patient: Private  Residence   [facility] of Provider[facility]    Persons participating in Telemedicine visit: Lisa [name]  Spanish Interpeter [name]   History of Present Illness: Telemedicine visit for: F/U  MAMMOGRAM: referred to [facility]   Essential Hypertension Well controlled. She does not own a blood pressure device. Denies chest pain, shortness of breath, palpitations, lightheadedness, dizziness, headaches or BLE edema. She is taking losartan 50mg  daily as prescribed.  BP Readings from Last 3 Encounters:  11/19/19 138/83  10/07/19 113/67  10/05/19 126/83    Left Facial numbness She is taking gabapentin for migraines and left sided facial numbness without symptoms of acute stroke including weakness, aphasia or facial droop. Valtrex and topamax have been ineffective. Facial  paresthesia is chronic and present since she was diagnosed with COVID several months ago.    Past Medical History:  Diagnosis Date  . Anxiety   . Arthritis   . Depression   . Hypertension   . Migraine     History reviewed. No pertinent surgical history.  Family History  Problem Relation Age of Onset  . Diabetes Mother   . Hypertension Mother     Social History   Socioeconomic History  . Marital status: Single    Spouse name: Not on file  . Number of children: Not on file  . Years of education: Not on file  . Highest education level: Not on file  Occupational History  . Not on file  Tobacco Use  . Smoking status: Never Smoker  . Smokeless tobacco: Never Used  Substance and Sexual Activity  . Alcohol use: No  . Drug use: No  . Sexual activity: Not on file  Other Topics Concern  . Not on file  Social History Narrative   Lives with 3 children (2 sons-[name] and 12/05/19, 1 daughter [name]). Employed   Social Determinants of Justine Null Strain:   . Difficulty of Paying Living Expenses:   Food Insecurity:   . Worried About Corporate investment banker in the Last Year:   . Programme researcher, broadcasting/film/video in the Last Year:   Transportation Needs:   . Barista (Medical):   Freight forwarder Lack of Transportation (Non-Medical):   Physical Activity:   . Days of Exercise per Week:   . Minutes of Exercise per Session:   Stress:   . Feeling of Stress :   Social Connections:   .  Frequency of Communication with Friends and Family:   . Frequency of Social Gatherings with Friends and Family:   . Attends Religious Services:   . Active Member of Clubs or Organizations:   . Attends Banker Meetings:   Marland Kitchen Marital Status:      Observations/Objective: Awake, alert and oriented x 3   Review of Systems  Constitutional: Negative for fever, malaise/fatigue and weight loss.  HENT: Negative.  Negative for nosebleeds.   Eyes: Negative.  Negative for blurred  vision, double vision and photophobia.  Respiratory: Negative.  Negative for cough and shortness of breath.   Cardiovascular: Negative.  Negative for chest pain, palpitations and leg swelling.  Gastrointestinal: Negative.  Negative for heartburn, nausea and vomiting.  Musculoskeletal: Negative.  Negative for myalgias.  Neurological: Positive for tingling and sensory change. Negative for dizziness, focal weakness, seizures and headaches.  Psychiatric/Behavioral: Negative.  Negative for suicidal ideas.    Assessment and Plan: Lisa [name] was seen today for follow-up.  Diagnoses and all orders for this visit:  Essential hypertension Continue all antihypertensives as prescribed.  Remember to bring in your blood pressure log with you for your follow up appointment.  DASH/Mediterranean Diets are healthier choices for HTN.    Left facial numbness -     gabapentin (NEURONTIN) 300 MG capsule; Take 1 capsule (300 mg total) by mouth 3 (three) times daily.     Follow Up Instructions Return in about 3 months (around 05/16/2020).     I discussed the assessment and treatment plan with the patient. The patient was provided an opportunity to ask questions and all were answered. The patient agreed with the plan and demonstrated an understanding of the instructions.   The patient was advised to call back or seek an in-person evaluation if the symptoms worsen or if the condition fails to improve as anticipated.  I provided 12 minutes of non-face-to-face time during this encounter including median intraservice time, reviewing previous notes, labs, imaging, medications and explaining diagnosis and management.  Claiborne Rigg, FNP-BC

## 2020-02-18 ENCOUNTER — Other Ambulatory Visit: Payer: Self-pay

## 2020-02-19 ENCOUNTER — Other Ambulatory Visit: Payer: Self-pay

## 2020-02-19 ENCOUNTER — Ambulatory Visit: Payer: Self-pay | Attending: Nurse Practitioner

## 2020-02-19 ENCOUNTER — Other Ambulatory Visit: Payer: Self-pay | Admitting: Nurse Practitioner

## 2020-02-19 DIAGNOSIS — I10 Essential (primary) hypertension: Secondary | ICD-10-CM

## 2020-02-19 DIAGNOSIS — E559 Vitamin D deficiency, unspecified: Secondary | ICD-10-CM

## 2020-02-20 LAB — CMP14+EGFR
ALT: 25 IU/L (ref 0–32)
AST: 24 IU/L (ref 0–40)
Albumin/Globulin Ratio: 1.7 (ref 1.2–2.2)
Albumin: 4.5 g/dL (ref 3.8–4.8)
Alkaline Phosphatase: 92 IU/L (ref 48–121)
BUN/Creatinine Ratio: 21 (ref 9–23)
BUN: 12 mg/dL (ref 6–24)
Bilirubin Total: 0.8 mg/dL (ref 0.0–1.2)
CO2: 27 mmol/L (ref 20–29)
Calcium: 9.6 mg/dL (ref 8.7–10.2)
Chloride: 104 mmol/L (ref 96–106)
Creatinine, Ser: 0.57 mg/dL (ref 0.57–1.00)
GFR calc Af Amer: 129 mL/min/{1.73_m2} (ref >59)
GFR calc non Af Amer: 112 mL/min/{1.73_m2} (ref >59)
Globulin, Total: 2.6 g/dL (ref 1.5–4.5)
Glucose: 94 mg/dL (ref 65–99)
Potassium: 4.1 mmol/L (ref 3.5–5.2)
Sodium: 144 mmol/L (ref 134–144)
Total Protein: 7.1 g/dL (ref 6.0–8.5)

## 2020-02-20 LAB — VITAMIN D 25 HYDROXY (VIT D DEFICIENCY, FRACTURES): Vit D, 25-Hydroxy: 34 ng/mL (ref 30.0–100.0)

## 2020-02-26 ENCOUNTER — Other Ambulatory Visit: Payer: Self-pay

## 2020-02-26 DIAGNOSIS — N644 Mastodynia: Secondary | ICD-10-CM

## 2020-03-10 ENCOUNTER — Ambulatory Visit
Admission: RE | Admit: 2020-03-10 | Discharge: 2020-03-10 | Disposition: A | Discharge status: Home / Self Care | Payer: No Typology Code available for payment source | Source: Ambulatory Visit | Attending: Obstetrics and Gynecology | Admitting: Obstetrics and Gynecology

## 2020-03-10 ENCOUNTER — Other Ambulatory Visit: Payer: Self-pay

## 2020-03-10 ENCOUNTER — Ambulatory Visit: Payer: Self-pay | Admitting: *Deleted

## 2020-03-10 ENCOUNTER — Ambulatory Visit: Payer: Self-pay

## 2020-03-10 VITALS — BP 129/89 | Temp 97.7°F | Wt 159.5 lb

## 2020-03-10 DIAGNOSIS — N644 Mastodynia: Secondary | ICD-10-CM

## 2020-03-10 DIAGNOSIS — Z1239 Encounter for other screening for malignant neoplasm of breast: Secondary | ICD-10-CM

## 2020-03-10 NOTE — Patient Instructions (Signed)
Explained breast self awareness with [name]. Patient did not need a Pap smear today due to last Pap smear was 09/30/2019. Let her know that her next Pap smear is due in one year since it was abnormal. Referred [facility] to [facility] for a diagnostic mammogram. Appointment scheduled Tuesday, March 10, 2020 at 1520. Patient aware of appointment and will be there. [name] Nagele verbalized understanding.  Loyde Orth, [name], RN 3:00 PM

## 2020-03-10 NOTE — Progress Notes (Addendum)
[name] is a 49 y.o. female who presents to [facility] clinic today with complaint of right upper breast pain when touched and a possible right breast lump x 2 months..    Pap Smear: Pap smear not completed today. Last Pap smear was 09/30/2019 at [facility] and was ASCUS with negative HPV. Per patient her most recent Pap smear is the only abnormal Pap smear she has had. Last Pap smear result is available in Epic.   Physical exam: Breasts Breasts symmetrical. No skin abnormalities bilateral breasts. No nipple retraction bilateral breasts. No nipple discharge bilateral breasts. No lymphadenopathy. No lumps palpated bilateral breasts. No lumps palpated in patients area of concern within the right breast. Complaints of right upper inner breast tenderness on exam.       Pelvic/Bimanual Pap is not indicated today per BCCCP guidelines.    Smoking History: Patient has never smoked.   Patient Navigation: Patient education provided. Access to services provided for patient through [facility] program. Spanish interpreter Lisa [name] from [facility] provided.    Breast and Cervical Cancer Risk Assessment: Patient does not have family history of breast cancer, known genetic mutations, or radiation treatment to the chest before age 26. Patient does not have history of cervical dysplasia, immunocompromised, or DES exposure in-utero.  Risk Assessment    Risk Scores      03/10/2020   Last edited by: Meryl Dare, CMA   5-year risk: 0.6 %   Lifetime risk: 6.3 %          A: BCCCP exam without pap smear Complaint of right upper breast pain when touched and possible lump x 2 months.  P: Referred patient to the [facility] for a diagnostic mammogram. Appointment scheduled Tuesday, March 10, 2020 at 1520.  Priscille Heidelberg, RN 03/10/2020 3:00 PM

## 2020-03-25 ENCOUNTER — Other Ambulatory Visit: Payer: Self-pay

## 2020-03-25 ENCOUNTER — Encounter: Payer: Self-pay | Admitting: Nurse Practitioner

## 2020-03-25 ENCOUNTER — Ambulatory Visit: Payer: Self-pay | Attending: Nurse Practitioner | Admitting: Nurse Practitioner

## 2020-03-25 VITALS — BP 131/82 | HR 59 | Temp 97.7°F | Ht 60.0 in | Wt 163.8 lb

## 2020-03-25 DIAGNOSIS — R200 Anesthesia of skin: Secondary | ICD-10-CM

## 2020-03-25 DIAGNOSIS — R7303 Prediabetes: Secondary | ICD-10-CM

## 2020-03-25 DIAGNOSIS — Z1159 Encounter for screening for other viral diseases: Secondary | ICD-10-CM

## 2020-03-25 DIAGNOSIS — I10 Essential (primary) hypertension: Secondary | ICD-10-CM

## 2020-03-25 LAB — POCT GLYCOSYLATED HEMOGLOBIN (HGB A1C): Hemoglobin A1C: 5.3 % (ref 4.0–5.6)

## 2020-03-25 LAB — GLUCOSE, POCT (MANUAL RESULT ENTRY): POC Glucose: 102 mg/dl — AB (ref 70–99)

## 2020-03-25 NOTE — Patient Instructions (Signed)
[facility] [place] Address: [address], [place], [place] [address] Phone: [phone]

## 2020-03-25 NOTE — Progress Notes (Signed)
Assessment & Plan:  Lisa [name] was seen today for hypertension.  Diagnoses and all orders for this visit:  Left facial numbness F/U with Neurology   Essential hypertension Continue all antihypertensives as prescribed.  Remember to bring in your blood pressure log with you for your follow up appointment.  DASH/Mediterranean Diets are healthier choices for HTN.    Need for hepatitis C screening test -     Hepatitis C Antibody  Prediabetes -     Glucose (CBG) -     HgB A1c    Patient has been counseled on age-appropriate routine health concerns for screening and prevention. These are reviewed and up-to-date. Referrals have been placed accordingly. Immunizations are up-to-date or declined.    Subjective:   Chief Complaint  Patient presents with  . Hypertension    Pt. is here for hypertension and prediabetes follow up.    HPI Lisa [name] Delillo 49 y.o. female presents to office today for follow up to HTN.  She is still experiencing the   Essential Hypertension Taking losartan 50 mg daily as prescribed. Blood pressure is well controlled. Denies chest pain, shortness of breath, palpitations, lightheadedness, dizziness, headaches or BLE edema.  BP Readings from Last 3 Encounters:  03/25/20 131/82  03/10/20 129/89  11/19/19 138/83    Prediabetes Well controlled without the use of oral diabetic agents.  Lab Results  Component Value Date   HGBA1C 5.3 03/25/2020   Lab Results  Component Value Date   HGBA1C 5.7 (H) 09/30/2019   Left sided Facial/Head Numbness She has been experiencing facial and left sided parietal paresthesia. She has been prescribed gabapentin, topamax and valtrex with some minimal resolution of paresthesia in face but not on the left side of her head. She is without symptoms of stroke including weakness, aphasia or facial droop. Facial paresthesia is chronic and present since she was diagnosed with COVID earlier this year (October 01, 2019). Symptoms  initially started out as complaints of bilateral ear pressure/pain, achiness and intermittent left sided parietal headache described as numbness and tingling and numbess in left cheek and around left eye.  Past history is significant for migraines. Patient is non-smoker.  Diagnosed with COVID march 30th.    She was contacted by neurology to schedule back in July however she states she was told they would call back with a spanish interpreter and she never heard back.   Review of Systems  Constitutional: Negative for fever, malaise/fatigue and weight loss.  HENT: Negative.  Negative for nosebleeds.   Eyes: Negative.  Negative for blurred vision, double vision and photophobia.  Respiratory: Negative.  Negative for cough and shortness of breath.   Cardiovascular: Negative.  Negative for chest pain, palpitations and leg swelling.  Gastrointestinal: Negative.  Negative for heartburn, nausea and vomiting.  Musculoskeletal: Negative.  Negative for myalgias.  Neurological: Positive for tingling and sensory change. Negative for dizziness, focal weakness, seizures and headaches.  Psychiatric/Behavioral: Negative.  Negative for suicidal ideas.    Past Medical History:  Diagnosis Date  . Anxiety   . Arthritis   . Depression   . Hypertension   . Migraine     History reviewed. No pertinent surgical history.  Family History  Problem Relation Age of Onset  . Diabetes Mother   . Hypertension Mother   . Hypertension Father     Social History Reviewed with no changes to be made today.   Outpatient Medications Prior to Visit  Medication Sig Dispense Refill  . fluticasone (  FLONASE) 50 MCG/ACT nasal spray Place 2 sprays into both nostrils daily. 16 g 6  . losartan (COZAAR) 50 MG tablet Take 1 tablet (50 mg total) by mouth daily. 90 tablet 3  . valACYclovir (VALTREX) 500 MG tablet Take 1 tablet (500 mg total) by mouth daily. 90 tablet 1  . gabapentin (NEURONTIN) 300 MG capsule Take 1 capsule (300  mg total) by mouth 3 (three) times daily. 90 capsule 1  . pravastatin (PRAVACHOL) 10 MG tablet Take 1 tablet (10 mg total) by mouth daily. 90 tablet 1   No facility-administered medications prior to visit.    No Known Allergies     Objective:    BP 131/82 (BP Location: Left Arm, Patient Position: Sitting, Cuff Size: Normal)   Pulse (!) 59   Temp 97.7 F (36.5 C) (Temporal)   Ht 5' (1.524 m)   Wt 163 lb 12.8 oz (74.3 kg)   SpO2 100%   BMI 31.99 kg/m  Wt Readings from Last 3 Encounters:  03/25/20 163 lb 12.8 oz (74.3 kg)  03/10/20 159 lb 8 oz (72.3 kg)  11/19/19 168 lb 12.8 oz (76.6 kg)    Physical Exam Vitals and nursing note reviewed.  Constitutional:      Appearance: She is well-developed.  HENT:     Head: Normocephalic and atraumatic.  Cardiovascular:     Rate and Rhythm: Normal rate and regular rhythm.     Heart sounds: Normal heart sounds. No murmur heard.  No friction rub. No gallop.   Pulmonary:     Effort: Pulmonary effort is normal. No tachypnea or respiratory distress.     Breath sounds: Normal breath sounds. No decreased breath sounds, wheezing, rhonchi or rales.  Chest:     Chest wall: No tenderness.  Abdominal:     General: Bowel sounds are normal.     Palpations: Abdomen is soft.  Musculoskeletal:        General: Normal range of motion.     Cervical back: Normal range of motion.  Skin:    General: Skin is warm and dry.  Neurological:     Mental Status: She is alert and oriented to person, place, and time.     Coordination: Coordination normal.  Psychiatric:        Behavior: Behavior normal. Behavior is cooperative.        Thought Content: Thought content normal.        Judgment: Judgment normal.          Patient has been counseled extensively about nutrition and exercise as well as the importance of adherence with medications and regular follow-up. The patient was given clear instructions to go to ER or return to medical center if symptoms  don't improve, worsen or new problems develop. The patient verbalized understanding.   Follow-up: Return in about 3 months (around 06/24/2020).   Claiborne Rigg, FNP-BC [facility] [place], [place] [phone]   03/25/2020, 4:27 PM

## 2020-03-26 LAB — HEPATITIS C ANTIBODY: Hep C Virus Ab: <0.1 s/co ratio (ref 0.0–0.9)

## 2020-03-27 ENCOUNTER — Encounter: Payer: Self-pay | Admitting: *Deleted

## 2020-04-01 ENCOUNTER — Ambulatory Visit: Payer: Self-pay | Admitting: Nurse Practitioner

## 2020-06-24 ENCOUNTER — Other Ambulatory Visit: Payer: Self-pay

## 2020-06-24 ENCOUNTER — Ambulatory Visit: Payer: Self-pay | Attending: Nurse Practitioner | Admitting: Nurse Practitioner

## 2020-06-24 NOTE — Progress Notes (Signed)
error 

## 2020-06-26 ENCOUNTER — Other Ambulatory Visit: Payer: Self-pay | Admitting: Nurse Practitioner

## 2020-06-26 DIAGNOSIS — I10 Essential (primary) hypertension: Secondary | ICD-10-CM

## 2020-06-26 DIAGNOSIS — D649 Anemia, unspecified: Secondary | ICD-10-CM

## 2020-06-26 DIAGNOSIS — E782 Mixed hyperlipidemia: Secondary | ICD-10-CM

## 2020-06-30 ENCOUNTER — Ambulatory Visit: Payer: No Typology Code available for payment source | Attending: Family Medicine

## 2020-06-30 ENCOUNTER — Other Ambulatory Visit: Payer: Self-pay

## 2020-06-30 DIAGNOSIS — E782 Mixed hyperlipidemia: Secondary | ICD-10-CM

## 2020-06-30 DIAGNOSIS — I10 Essential (primary) hypertension: Secondary | ICD-10-CM

## 2020-06-30 DIAGNOSIS — D649 Anemia, unspecified: Secondary | ICD-10-CM

## 2020-07-01 LAB — LIPID PANEL
Chol/HDL Ratio: 3.2 ratio (ref 0.0–4.4)
Cholesterol, Total: 178 mg/dL (ref 100–199)
HDL: 55 mg/dL (ref >39)
LDL Chol Calc (NIH): 96 mg/dL (ref 0–99)
Triglycerides: 153 mg/dL — ABNORMAL HIGH (ref 0–149)
VLDL Cholesterol Cal: 27 mg/dL (ref 5–40)

## 2020-07-01 LAB — CMP14+EGFR
ALT: 18 IU/L (ref 0–32)
AST: 20 IU/L (ref 0–40)
Albumin/Globulin Ratio: 1.8 (ref 1.2–2.2)
Albumin: 4.6 g/dL (ref 3.8–4.8)
Alkaline Phosphatase: 107 IU/L (ref 44–121)
BUN/Creatinine Ratio: 22 (ref 9–23)
BUN: 13 mg/dL (ref 6–24)
Bilirubin Total: 0.4 mg/dL (ref 0.0–1.2)
CO2: 26 mmol/L (ref 20–29)
Calcium: 9.4 mg/dL (ref 8.7–10.2)
Chloride: 105 mmol/L (ref 96–106)
Creatinine, Ser: 0.58 mg/dL (ref 0.57–1.00)
GFR calc Af Amer: 127 mL/min/{1.73_m2} (ref >59)
GFR calc non Af Amer: 110 mL/min/{1.73_m2} (ref >59)
Globulin, Total: 2.5 g/dL (ref 1.5–4.5)
Glucose: 90 mg/dL (ref 65–99)
Potassium: 4.5 mmol/L (ref 3.5–5.2)
Sodium: 143 mmol/L (ref 134–144)
Total Protein: 7.1 g/dL (ref 6.0–8.5)

## 2020-07-01 LAB — CBC
Hematocrit: 40.3 % (ref 34.0–46.6)
Hemoglobin: 13.7 g/dL (ref 11.1–15.9)
MCH: 31.4 pg (ref 26.6–33.0)
MCHC: 34 g/dL (ref 31.5–35.7)
MCV: 92 fL (ref 79–97)
Platelets: 271 10*3/uL (ref 150–450)
RBC: 4.37 x10E6/uL (ref 3.77–5.28)
RDW: 12.5 % (ref 11.7–15.4)
WBC: 9.5 10*3/uL (ref 3.4–10.8)

## 2020-09-22 ENCOUNTER — Encounter (INDEPENDENT_AMBULATORY_CARE_PROVIDER_SITE_OTHER): Payer: Self-pay

## 2020-09-22 ENCOUNTER — Other Ambulatory Visit: Payer: Self-pay

## 2020-09-22 ENCOUNTER — Ambulatory Visit: Payer: Self-pay | Attending: Nurse Practitioner | Admitting: Nurse Practitioner

## 2020-09-22 ENCOUNTER — Other Ambulatory Visit: Payer: Self-pay | Admitting: Nurse Practitioner

## 2020-09-22 ENCOUNTER — Encounter: Payer: Self-pay | Admitting: Nurse Practitioner

## 2020-09-22 DIAGNOSIS — I10 Essential (primary) hypertension: Secondary | ICD-10-CM

## 2020-09-22 DIAGNOSIS — N920 Excessive and frequent menstruation with regular cycle: Secondary | ICD-10-CM

## 2020-09-22 DIAGNOSIS — E7800 Pure hypercholesterolemia, unspecified: Secondary | ICD-10-CM

## 2020-09-22 DIAGNOSIS — R7303 Prediabetes: Secondary | ICD-10-CM

## 2020-09-22 DIAGNOSIS — Z1211 Encounter for screening for malignant neoplasm of colon: Secondary | ICD-10-CM

## 2020-09-22 MED ORDER — PRAVASTATIN SODIUM 10 MG PO TABS: 10.0000 mg | ORAL_TABLET | Freq: Every day | ORAL | 1 refills | Status: DC

## 2020-09-22 NOTE — Progress Notes (Signed)
Virtual Visit via Telephone Note Due to national recommendations of social distancing due to [place] 19, telehealth visit is felt to be most appropriate for this patient at this time.  I discussed the limitations, risks, security and privacy concerns of performing an evaluation and management service by telephone and the availability of in person appointments. I also discussed with the patient that there may be a patient responsible charge related to this service. The patient expressed understanding and agreed to proceed.    I connected with Lisa [name] on 09/22/20  at   4:10 PM EDT  EDT by telephone and verified that I am speaking with the correct person using two identifiers.   Consent I discussed the limitations, risks, security and privacy concerns of performing an evaluation and management service by telephone and the availability of in person appointments. I also discussed with the patient that there may be a patient responsible charge related to this service. The patient expressed understanding and agreed to proceed.   Location of Patient: Private  Residence   Location of Provider: [place] and [employer] Office    Persons participating in Telemedicine visit: Lisa Rankins FNP-BC [place]    History of Present Illness: Telemedicine visit for: F/U She has a past medical history of Anxiety, Arthritis, Depression, Hypertension, Prediabetes and Migraine.  Predaibetes Well controlled with diet only at this time.  Essential Hypertension Well controlled. Taking losartan 50 mg as prescribed. Denies chest pain, shortness of breath, palpitations, lightheadedness, dizziness, headaches or BLE edema.  BP Readings from Last 3 Encounters:  03/25/20 131/82  03/10/20 129/89  11/19/19 138/83   Dyslipidemia LDL not at goal with pravastatin 10 mg daily.  Lab Results  Component Value Date   Chelsea 96 06/30/2020   Past Medical History:  Diagnosis Date  .  Anxiety   . Arthritis   . Depression   . Hypertension   . Migraine     History reviewed. No pertinent surgical history.  Family History  Problem Relation Age of Onset  . Diabetes Mother   . Hypertension Mother   . Hypertension Father     Social History   Socioeconomic History  . Marital status: Single    Spouse name: Not on file  . Number of children: Not on file  . Years of education: Not on file  . Highest education level: 7th grade  Occupational History  . Not on file  Tobacco Use  . Smoking status: Never Smoker  . Smokeless tobacco: Never Used  Vaping Use  . Vaping Use: Never used  Substance and Sexual Activity  . Alcohol use: No  . Drug use: No  . Sexual activity: Not Currently    Birth control/protection: None  Other Topics Concern  . Not on file  Social History Narrative   Lives with 3 children (2 sons-[name] and [name], 1 daughter [name]). Employed   Social Determinants of Radio broadcast assistant Strain: Not on file  Food Insecurity: Not on file  Transportation Needs: Unmet Transportation Needs  . Lack of Transportation (Medical): Yes  . Lack of Transportation (Non-Medical): Yes  Physical Activity: Not on file  Stress: Not on file  Social Connections: Not on file     Observations/Objective: Awake, alert and oriented x 3   Review of Systems  Constitutional: Negative for fever, malaise/fatigue and weight loss.  HENT: Negative.  Negative for nosebleeds.   Eyes: Negative.  Negative for blurred vision, double vision  and photophobia.  Respiratory: Negative.  Negative for cough and shortness of breath.   Cardiovascular: Negative.  Negative for chest pain, palpitations and leg swelling.  Gastrointestinal: Negative.  Negative for heartburn, nausea and vomiting.  Musculoskeletal: Negative.  Negative for myalgias.  Neurological: Negative.  Negative for dizziness, focal weakness, seizures and headaches.  Psychiatric/Behavioral: Negative.   Negative for suicidal ideas.    Assessment and Plan: Diagnoses and all orders for this visit:  Essential hypertension -     CMP14+EGFR Continue all antihypertensives as prescribed.  Remember to bring in your blood pressure log with you for your follow up appointment.  DASH/Mediterranean Diets are healthier choices for HTN.    Elevated LDL cholesterol level -     Lipid panel -     pravastatin (PRAVACHOL) 10 MG tablet; Take 1 tablet (10 mg total) by mouth daily. INSTRUCTIONS: Work on a low fat, heart healthy diet and participate in regular aerobic exercise program by working out at least 150 minutes per week; 5 days a week-30 minutes per day. Avoid red meat/beef/steak,  fried foods. junk foods, sodas, sugary drinks, unhealthy snacking, alcohol and smoking.  Drink at least 80 oz of water per day and monitor your carbohydrate intake daily.    Menorrhagia with regular cycle -     CBC  Prediabetes -     Hemoglobin A1c Continue blood sugar control as discussed in office today, low carbohydrate diet, and regular physical exercise as tolerated, 150 minutes per week (30 min each day, 5 days per week, or 50 min 3 days per week).       Follow Up Instructions Return in about 3 months (around 12/23/2020).     I discussed the assessment and treatment plan with the patient. The patient was provided an opportunity to ask questions and all were answered. The patient agreed with the plan and demonstrated an understanding of the instructions.   The patient was advised to call back or seek an in-person evaluation if the symptoms worsen or if the condition fails to improve as anticipated.  I provided 12 minutes of non-face-to-face time during this encounter including median intraservice time, reviewing previous notes, labs, imaging, medications and explaining diagnosis and management.  Gildardo Pounds, FNP-BC

## 2020-09-24 LAB — CMP14+EGFR
ALT: 19 IU/L (ref 0–32)
AST: 17 IU/L (ref 0–40)
Albumin/Globulin Ratio: 1.8 (ref 1.2–2.2)
Albumin: 4.7 g/dL (ref 3.8–4.8)
Alkaline Phosphatase: 95 IU/L (ref 44–121)
BUN/Creatinine Ratio: 24 — ABNORMAL HIGH (ref 9–23)
BUN: 13 mg/dL (ref 6–24)
Bilirubin Total: 0.5 mg/dL (ref 0.0–1.2)
CO2: 22 mmol/L (ref 20–29)
Calcium: 9.6 mg/dL (ref 8.7–10.2)
Chloride: 103 mmol/L (ref 96–106)
Creatinine, Ser: 0.55 mg/dL — ABNORMAL LOW (ref 0.57–1.00)
Globulin, Total: 2.6 g/dL (ref 1.5–4.5)
Glucose: 96 mg/dL (ref 65–99)
Potassium: 4.6 mmol/L (ref 3.5–5.2)
Sodium: 140 mmol/L (ref 134–144)
Total Protein: 7.3 g/dL (ref 6.0–8.5)
eGFR: 114 mL/min/{1.73_m2} (ref >59)

## 2020-09-24 LAB — CBC
Hematocrit: 40.2 % (ref 34.0–46.6)
Hemoglobin: 13.3 g/dL (ref 11.1–15.9)
MCH: 30.9 pg (ref 26.6–33.0)
MCHC: 33.1 g/dL (ref 31.5–35.7)
MCV: 93 fL (ref 79–97)
Platelets: 267 10*3/uL (ref 150–450)
RBC: 4.31 x10E6/uL (ref 3.77–5.28)
RDW: 12.9 % (ref 11.7–15.4)
WBC: 6 10*3/uL (ref 3.4–10.8)

## 2020-09-24 LAB — HEMOGLOBIN A1C
Est. average glucose Bld gHb Est-mCnc: 105 mg/dL
Hgb A1c MFr Bld: 5.3 % (ref 4.8–5.6)

## 2020-09-24 LAB — LIPID PANEL
Chol/HDL Ratio: 3.5 ratio (ref 0.0–4.4)
Cholesterol, Total: 206 mg/dL — ABNORMAL HIGH (ref 100–199)
HDL: 59 mg/dL (ref >39)
LDL Chol Calc (NIH): 130 mg/dL — ABNORMAL HIGH (ref 0–99)
Triglycerides: 94 mg/dL (ref 0–149)
VLDL Cholesterol Cal: 17 mg/dL (ref 5–40)

## 2020-10-01 ENCOUNTER — Other Ambulatory Visit: Payer: Self-pay

## 2020-10-01 DIAGNOSIS — I10 Essential (primary) hypertension: Secondary | ICD-10-CM

## 2020-10-02 LAB — FECAL OCCULT BLOOD, IMMUNOCHEMICAL: Fecal Occult Bld: NEGATIVE

## 2020-10-05 ENCOUNTER — Other Ambulatory Visit: Payer: Self-pay

## 2020-10-05 ENCOUNTER — Other Ambulatory Visit: Payer: Self-pay | Admitting: *Deleted

## 2020-10-05 DIAGNOSIS — Z124 Encounter for screening for malignant neoplasm of cervix: Secondary | ICD-10-CM

## 2020-10-05 NOTE — Progress Notes (Signed)
Patient: Lisa [name]           Date of Birth: May 31, 1971           MRN: [id number] Visit Date: 10/05/2020 PCP: Claiborne Rigg, NP  Cervical Cancer Screening Do you smoke?: No Have you ever had or been told you have an allergy to latex products?: No Marital status: Single Date of last pap smear: 1-2 yrs ago (09/30/19 (ASCUS, - HPV)) Date of last menstrual period:  (post-menopausal) Number of pregnancies: 4 Number of births: 4 Have you ever had any of the following? Hysterectomy: No Tubal ligation (tubes tied): No Abnormal bleeding: Yes Abnormal pap smear: Yes (09/30/19 (ASCUS)) Venereal warts: No A sex partner with venereal warts: No A high risk* sex partner: No  Cervical Exam  Abnormal Observations: Cervix reddened around os. Recommendations: Last Pap smear was 09/30/2019 at [facility] and was ASCUS with negative HPV. Per patient her most recent Pap smear is the only abnormal Pap smear she has had. Last Pap smear result is available in Epic. Let patient know that if today's Pap smear is normal and HPV negative that her next Pap smear is due in 5 years. Informed patient that will follow-up with her within the next couple of weeks with results of her Pap smear by phone or letter.    Used Spanish interpreter [employer] from [employer].  Patient's History Patient Active Problem List   Diagnosis Date Noted  . Pain in both lower extremities 05/10/2017  . Elevated LDL cholesterol level 01/02/2017  . Menorrhagia with regular cycle 03/13/2014  . Back pain 02/28/2014  . Dizziness 02/28/2014  . Muscle strain 02/20/2014  . 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 Depressive disorder 02/01/2011  . Fatigue 11/22/2010  . OBESITY, NOS 08/31/2006  . Migraine headache 08/31/2006  . HYPERTENSION, BENIGN SYSTEMIC 08/31/2006   Past Medical History:  Diagnosis Date  . Anxiety   . Arthritis   . Depression   . Hypertension   . Migraine      Family History  Problem Relation Age of Onset  . Diabetes Mother   . Hypertension Mother   . Hypertension Father     Social History   Occupational History  . Not on file  Tobacco Use  . Smoking status: Never Smoker  . Smokeless tobacco: Never Used  Vaping Use  . Vaping Use: Never used  Substance and Sexual Activity  . Alcohol use: No  . Drug use: No  . Sexual activity: Not Currently    Birth control/protection: None

## 2020-10-08 ENCOUNTER — Telehealth: Payer: Self-pay

## 2020-10-08 LAB — CYTOLOGY - PAP
Comment: NEGATIVE
Diagnosis: NEGATIVE
High risk HPV: NEGATIVE

## 2020-10-08 NOTE — Telephone Encounter (Signed)
Called patient to give pap smear results via Erika McReynolds, [employer]. Informed patient that pap smear was normal and HPV was negative. Based on this result her next pap smear will be due in 5 years. Patient voiced understanding.  

## 2020-12-25 ENCOUNTER — Encounter: Payer: Self-pay | Admitting: Nurse Practitioner

## 2020-12-25 ENCOUNTER — Ambulatory Visit: Payer: Self-pay | Attending: Nurse Practitioner | Admitting: Nurse Practitioner

## 2020-12-25 ENCOUNTER — Other Ambulatory Visit: Payer: Self-pay

## 2020-12-25 DIAGNOSIS — R519 Headache, unspecified: Secondary | ICD-10-CM

## 2020-12-25 DIAGNOSIS — R200 Anesthesia of skin: Secondary | ICD-10-CM

## 2020-12-25 DIAGNOSIS — E785 Hyperlipidemia, unspecified: Secondary | ICD-10-CM

## 2020-12-25 DIAGNOSIS — I10 Essential (primary) hypertension: Secondary | ICD-10-CM

## 2020-12-25 MED ORDER — VALACYCLOVIR HCL 500 MG PO TABS
500.0000 mg | ORAL_TABLET | Freq: Every day | ORAL | 1 refills | Status: DC
  Filled 2020-12-25: qty 30, 30d supply, fill #0

## 2020-12-25 MED ORDER — LOSARTAN POTASSIUM 50 MG PO TABS
ORAL_TABLET | Freq: Every day | ORAL | 3 refills | Status: DC
  Filled 2020-12-25: qty 30, 30d supply, fill #0

## 2020-12-25 MED ORDER — FLUTICASONE PROPIONATE 50 MCG/ACT NA SUSP
2.0000 | Freq: Every day | NASAL | 6 refills | Status: DC
  Filled 2020-12-25: qty 16, 30d supply, fill #0

## 2020-12-25 MED ORDER — PRAVASTATIN SODIUM 10 MG PO TABS
ORAL_TABLET | Freq: Every day | ORAL | 1 refills | Status: DC
  Filled 2020-12-25: qty 30, 30d supply, fill #0

## 2020-12-25 MED ORDER — GABAPENTIN 300 MG PO CAPS
300.0000 mg | ORAL_CAPSULE | Freq: Three times a day (TID) | ORAL | 1 refills | Status: DC
  Filled 2020-12-25: qty 90, 30d supply, fill #0

## 2020-12-25 NOTE — Progress Notes (Signed)
Virtual Visit via Telephone Note Due to national recommendations of social distancing due to COVID 19, telehealth visit is felt to be most appropriate for this patient at this time.  I discussed the limitations, risks, security and privacy concerns of performing an evaluation and management service by telephone and the availability of in person appointments. I also discussed with the patient that there may be a patient responsible charge related to this service. The patient expressed understanding and agreed to proceed.    I connected with Lisa [name] on 12/25/20  at   2:50 PM EDT  EDT by telephone and verified that I am speaking with the correct person using two identifiers.  Location of Patient: Private Residence   [facility] of Provider[facility] [employer] [facility]    Persons participating in Telemedicine visit: Lisa [name] Lisa [name]  Lisa [name]   History of Present Illness: Telemedicine visit for: Follow up She has a past medical history of Anxiety, Arthritis, Depression, Hypertension, and Migraine.   Denies chest pain, shortness of breath, palpitations, lightheadedness, dizziness, headaches or BLE edema.    Essential Hypertension Blood pressure is well controlled. She is taking losartan 50 mg daily as prescribed. Denies chest pain, shortness of breath, palpitations, lightheadedness, dizziness, headaches or BLE edema.   BP Readings from Last 3 Encounters:  03/25/20 131/82  03/10/20 129/89  11/19/19 138/83   Dyslipidemia LDL not at goal. She has been out of her pravastatin 10 mg. Denies any statin intolerance.  Lab Results  Component Value Date   CHOL 206 (H) 09/22/2020   CHOL 178 06/30/2020   CHOL 180 09/30/2019   Lab Results  Component Value Date   HDL 59 09/22/2020   HDL 55 06/30/2020   HDL 56 09/30/2019   Lab Results  Component Value Date   LDLCALC 130 (H) 09/22/2020   LDLCALC 96 06/30/2020   LDLCALC 108  (H) 09/30/2019   Lab Results  Component Value Date   TRIG 94 09/22/2020   TRIG 153 (H) 06/30/2020   TRIG 87 09/30/2019   Lab Results  Component Value Date   CHOLHDL 3.5 09/22/2020   CHOLHDL 3.2 06/30/2020   CHOLHDL 3.2 09/30/2019   No results found for: LDLDIRECT    Past Medical History:  Diagnosis Date   Anxiety    Arthritis    Depression    Hypertension    Migraine     History reviewed. No pertinent surgical history.  Family History  Problem Relation Age of Onset   Diabetes Mother    Hypertension Mother    Hypertension Father     Social History   Socioeconomic History   Marital status: Single    Spouse name: Not on file   Number of children: Not on file   Years of education: Not on file   Highest education level: 7th grade  Occupational History   Not on file  Tobacco Use   Smoking status: Never   Smokeless tobacco: Never  Vaping Use   Vaping Use: Never used  Substance and Sexual Activity   Alcohol use: No   Drug use: No   Sexual activity: Not Currently    Birth control/protection: None  Other Topics Concern   Not on file  Social History Narrative   Lives with 3 children (2 sons-Lisa [name] and Lisa [name], 1 daughter Lisa [name]). Employed   Social Determinants of Corporate investment banker Strain: Not on file  Food Insecurity: Not on file  Transportation Needs: Personal assistant (Medical): Yes   Lack of Transportation (Non-Medical): Yes  Physical Activity: Not on file  Stress: Not on file  Social Connections: Not on file     Observations/Objective: Awake, alert and oriented x 3   Review of Systems  Constitutional:  Negative for fever, malaise/fatigue and weight loss.  HENT:  Positive for congestion. Negative for nosebleeds.   Eyes: Negative.  Negative for blurred vision, double vision and photophobia.  Respiratory: Negative.  Negative for cough and shortness of breath.   Cardiovascular: Negative.   Negative for chest pain, palpitations and leg swelling.  Gastrointestinal: Negative.  Negative for heartburn, nausea and vomiting.  Musculoskeletal: Negative.  Negative for myalgias.  Neurological: Negative.  Negative for dizziness, focal weakness, seizures and headaches.  Psychiatric/Behavioral: Negative.  Negative for suicidal ideas.    Assessment and Plan: Diagnoses and all orders for this visit:  Primary hypertension Continue all antihypertensives as prescribed.  Remember to bring in your blood pressure log with you for your follow up appointment.  DASH/Mediterranean Diets are healthier choices for HTN.    Dyslipidemia, goal LDL below 100  INSTRUCTIONS: Work on a low fat, heart healthy diet and participate in regular aerobic exercise program by working out at least 150 minutes per week; 5 days a week-30 minutes per day. Avoid red meat/beef/steak,  fried foods. junk foods, sodas, sugary drinks, unhealthy snacking, alcohol and smoking.  Drink at least 80 oz of water per day and monitor your carbohydrate intake daily.    Follow Up Instructions Return in 2 months (on 02/24/2021).     I discussed the assessment and treatment plan with the patient. The patient was provided an opportunity to ask questions and all were answered. The patient agreed with the plan and demonstrated an understanding of the instructions.   The patient was advised to call back or seek an in-person evaluation if the symptoms worsen or if the condition fails to improve as anticipated.  I provided 10 minutes of non-face-to-face time during this encounter including median intraservice time, reviewing previous notes, labs, imaging, medications and explaining diagnosis and management.  Claiborne Rigg, [name]

## 2020-12-29 ENCOUNTER — Other Ambulatory Visit: Payer: Self-pay

## 2020-12-30 ENCOUNTER — Other Ambulatory Visit: Payer: Self-pay

## 2021-01-15 ENCOUNTER — Telehealth: Payer: Self-pay | Admitting: Nurse Practitioner

## 2021-01-15 NOTE — Telephone Encounter (Signed)
Pt requesting letter for immigration.

## 2021-01-15 NOTE — Telephone Encounter (Signed)
Mrs Wendi Maya accompanies pt Lisa [name] in her  appts in [facility] but is not on [place]. Mrs. Wendi Maya states [name] needs a letter from PCP for immigration purposes stating that she's a patient here and the medications she needs to take on a daily basis. She also states she needs the letter to state that it's very important  that she takes these medication. Pt was informed that this may take up to 14 days and would be notified of when it's ready for pick up. Please advise and thank you.

## 2021-01-18 ENCOUNTER — Encounter: Payer: Self-pay | Admitting: Nurse Practitioner

## 2021-01-18 NOTE — Telephone Encounter (Signed)
LETTER IS READY

## 2021-01-18 NOTE — Telephone Encounter (Signed)
Routing to PCP for review.

## 2021-01-18 NOTE — Telephone Encounter (Signed)
I have never seen this patient before

## 2021-01-19 NOTE — Telephone Encounter (Signed)
Pt has been informed that letter is ready for pick up. 

## 2021-02-08 ENCOUNTER — Other Ambulatory Visit: Payer: Self-pay | Admitting: Obstetrics and Gynecology

## 2021-02-08 DIAGNOSIS — Z1231 Encounter for screening mammogram for malignant neoplasm of breast: Secondary | ICD-10-CM

## 2021-02-24 ENCOUNTER — Other Ambulatory Visit: Payer: Self-pay

## 2021-02-24 ENCOUNTER — Ambulatory Visit: Payer: Self-pay | Attending: Nurse Practitioner | Admitting: Nurse Practitioner

## 2021-02-24 ENCOUNTER — Encounter: Payer: Self-pay | Admitting: Nurse Practitioner

## 2021-02-24 VITALS — BP 126/81 | HR 61 | Resp 16 | Wt 169.6 lb

## 2021-02-24 DIAGNOSIS — F32A Depression, unspecified: Secondary | ICD-10-CM

## 2021-02-24 DIAGNOSIS — I10 Essential (primary) hypertension: Secondary | ICD-10-CM

## 2021-02-24 DIAGNOSIS — F329 Major depressive disorder, single episode, unspecified: Secondary | ICD-10-CM

## 2021-02-24 DIAGNOSIS — K5909 Other constipation: Secondary | ICD-10-CM

## 2021-02-24 DIAGNOSIS — E785 Hyperlipidemia, unspecified: Secondary | ICD-10-CM

## 2021-02-24 MED ORDER — LOSARTAN POTASSIUM 50 MG PO TABS
ORAL_TABLET | Freq: Every day | ORAL | 1 refills | Status: DC
  Filled 2021-02-24: qty 30, 30d supply, fill #0

## 2021-02-24 MED ORDER — PRAVASTATIN SODIUM 10 MG PO TABS
ORAL_TABLET | Freq: Every day | ORAL | 1 refills | Status: DC
  Filled 2021-02-24: qty 30, 30d supply, fill #0

## 2021-02-24 MED ORDER — SENNOSIDES-DOCUSATE SODIUM 8.6-50 MG PO TABS
2.0000 | ORAL_TABLET | Freq: Two times a day (BID) | ORAL | 3 refills | Status: AC
  Filled 2021-02-24: qty 120, 30d supply, fill #0

## 2021-02-24 MED ORDER — ESCITALOPRAM OXALATE 10 MG PO TABS
10.0000 mg | ORAL_TABLET | Freq: Every day | ORAL | 1 refills | Status: DC
  Filled 2021-02-24: qty 30, 30d supply, fill #0

## 2021-02-24 NOTE — Progress Notes (Signed)
Assessment & Plan:  Lisa [name] was seen today for hypertension.  Diagnoses and all orders for this visit:  Primary hypertension -     losartan (COZAAR) 50 MG tablet; TAKE 1 TABLET (50 MG TOTAL) BY MOUTH DAILY. Continue all antihypertensives as prescribed.  Remember to bring in your blood pressure log with you for your follow up appointment.  DASH/Mediterranean Diets are healthier choices for HTN.    Chronic constipation -     senna-docusate (SENOKOT-S) 8.6-50 MG tablet; Take 2 tablets by mouth 2 (two) times daily.  Depressive disorder -     escitalopram (LEXAPRO) 10 MG tablet; Take 1 tablet (10 mg total) by mouth daily.  Dyslipidemia, goal LDL below 100 -     pravastatin (PRAVACHOL) 10 MG tablet; TAKE 1 TABLET (10 MG TOTAL) BY MOUTH DAILY. INSTRUCTIONS: Work on a low fat, heart healthy diet and participate in regular aerobic exercise program by working out at least 150 minutes per week; 5 days a week-30 minutes per day. Avoid red meat/beef/steak,  fried foods. junk foods, sodas, sugary drinks, unhealthy snacking, alcohol and smoking.  Drink at least 80 oz of water per day and monitor your carbohydrate intake daily.     Patient has been counseled on age-appropriate routine health concerns for screening and prevention. These are reviewed and up-to-date. Referrals have been placed accordingly. Immunizations are up-to-date or declined.    Subjective:   Chief Complaint  Patient presents with   Hypertension   HPI Lisa [name] 50 y.o. female presents to office today for follow up to HTN.  She has a past medical history of Anxiety, Arthritis, Depression, Hypertension, and Migraine.   HTN Well controlled with losartan 50 mg daily. Denies chest pain, shortness of breath, palpitations, lightheadedness, dizziness, headaches or BLE edema.   BP Readings from Last 3 Encounters:  02/24/21 126/81  03/25/20 131/82  03/10/20 129/89    She notes early satiety and also endorses constipation.      Depression  Endorses increased fatigue and lack of motivation. PHQ9 is elevated. She was prescribed escitalopram in the past but can not recall if she took this medication. She is agreeable to starting today.  Depression screen [facility] 2/9 02/24/2021 03/25/2020 09/30/2019 07/24/2018 05/10/2017  Decreased Interest 3 1 1 3 3   Down, Depressed, Hopeless 2 3 1 1 3   PHQ - 2 Score 5 4 2 4 6   Altered sleeping 2 0 1 0 1  Tired, decreased energy 3 3 3 3 3   Change in appetite 0 0 1 0 0  Feeling bad or failure about yourself  0 0 0 0 0  Trouble concentrating 3 0 0 3 3  Moving slowly or fidgety/restless 0 0 0 0 0  Suicidal thoughts 0 0 0 0 0  PHQ-9 Score 13 7 7 10 13   Some recent data might be hidden    Review of Systems  Constitutional:  Negative for fever, malaise/fatigue and weight loss.  HENT: Negative.  Negative for nosebleeds.   Eyes: Negative.  Negative for blurred vision, double vision and photophobia.  Respiratory: Negative.  Negative for cough and shortness of breath.   Cardiovascular: Negative.  Negative for chest pain, palpitations and leg swelling.  Gastrointestinal:  Positive for constipation. Negative for abdominal pain, blood in stool, diarrhea, heartburn, melena, nausea and vomiting.  Musculoskeletal: Negative.  Negative for myalgias.  Neurological: Negative.  Negative for dizziness, focal weakness, seizures and headaches.  Psychiatric/Behavioral:  Positive for depression. Negative for suicidal ideas.  Past Medical History:  Diagnosis Date   Anxiety    Arthritis    Depression    Hypertension    Migraine     History reviewed. No pertinent surgical history.  Family History  Problem Relation Age of Onset   Diabetes Mother    Hypertension Mother    Hypertension Father     Social History Reviewed with no changes to be made today.   Outpatient Medications Prior to Visit  Medication Sig Dispense Refill   fluticasone (FLONASE) 50 MCG/ACT nasal spray PLACE 2 SPRAYS INTO  BOTH NOSTRILS DAILY. 16 g 6   gabapentin (NEURONTIN) 300 MG capsule Take 1 capsule (300 mg total) by mouth 3 (three) times daily. 90 capsule 1   valACYclovir (VALTREX) 500 MG tablet Take 1 tablet (500 mg total) by mouth daily. 90 tablet 1   losartan (COZAAR) 50 MG tablet TAKE 1 TABLET (50 MG TOTAL) BY MOUTH DAILY. 90 tablet 3   pravastatin (PRAVACHOL) 10 MG tablet TAKE 1 TABLET (10 MG TOTAL) BY MOUTH DAILY. 90 tablet 1   No facility-administered medications prior to visit.    No Known Allergies     Objective:    BP 126/81   Pulse 61   Resp 16   Wt 169 lb 9.6 oz (76.9 kg)   SpO2 96%   BMI 33.12 kg/m  Wt Readings from Last 3 Encounters:  02/24/21 169 lb 9.6 oz (76.9 kg)  03/25/20 163 lb 12.8 oz (74.3 kg)  03/10/20 159 lb 8 oz (72.3 kg)    Physical Exam Vitals and nursing note reviewed.  Constitutional:      Appearance: She is well-developed.  HENT:     Head: Normocephalic and atraumatic.  Cardiovascular:     Rate and Rhythm: Normal rate and regular rhythm.     Heart sounds: Normal heart sounds. No murmur heard.   No friction rub. No gallop.  Pulmonary:     Effort: Pulmonary effort is normal. No tachypnea or respiratory distress.     Breath sounds: Normal breath sounds. No decreased breath sounds, wheezing, rhonchi or rales.  Chest:     Chest wall: No tenderness.  Abdominal:     General: Bowel sounds are normal.     Palpations: Abdomen is soft.  Musculoskeletal:        General: Normal range of motion.     Cervical back: Normal range of motion.  Skin:    General: Skin is warm and dry.  Neurological:     Mental Status: She is alert and oriented to person, place, and time.     Coordination: Coordination normal.  Psychiatric:        Mood and Affect: Mood is depressed. Affect is flat.        Speech: Speech normal.        Behavior: Behavior normal. Behavior is cooperative.        Thought Content: Thought content normal.        Judgment: Judgment normal.          Patient has been counseled extensively about nutrition and exercise as well as the importance of adherence with medications and regular follow-up. The patient was given clear instructions to go to ER or return to medical center if symptoms don't improve, worsen or new problems develop. The patient verbalized understanding.   Follow-up: Return in about 4 weeks (around 03/24/2021) for tele constipation or depression 810 or 130  .   Claiborne Rigg, FNP-BC [facility]  and [facility] [place], [place] [phone]   02/24/2021, 9:26 PM

## 2021-03-04 ENCOUNTER — Other Ambulatory Visit: Payer: Self-pay | Admitting: Obstetrics and Gynecology

## 2021-03-04 DIAGNOSIS — Z1231 Encounter for screening mammogram for malignant neoplasm of breast: Secondary | ICD-10-CM

## 2021-03-11 ENCOUNTER — Ambulatory Visit: Payer: Self-pay | Admitting: *Deleted

## 2021-03-11 ENCOUNTER — Other Ambulatory Visit: Payer: Self-pay

## 2021-03-11 ENCOUNTER — Ambulatory Visit
Admission: RE | Admit: 2021-03-11 | Discharge: 2021-03-11 | Disposition: A | Discharge status: Home / Self Care | Payer: No Typology Code available for payment source | Source: Ambulatory Visit | Attending: Obstetrics and Gynecology | Admitting: Obstetrics and Gynecology

## 2021-03-11 VITALS — BP 140/88 | Wt 170.4 lb

## 2021-03-11 DIAGNOSIS — Z1231 Encounter for screening mammogram for malignant neoplasm of breast: Secondary | ICD-10-CM

## 2021-03-11 DIAGNOSIS — Z1239 Encounter for other screening for malignant neoplasm of breast: Secondary | ICD-10-CM

## 2021-03-11 NOTE — Patient Instructions (Signed)
Explained breast self awareness with [name]. Patient did not need a Pap smear today due to last Pap smear and HPV typing was 10/05/2020. Let her know [facility] will cover Pap smears and HPV typing every 5 years unless has a history of abnormal Pap smears. Referred patient to the [facility] for a screening mammogram on mobile unit. Appointment scheduled Thursday, March 11, 2021 at 1550. Patient escorted to the mobile unit following [facility] appointment for her screening mammogram. Let patient know the Breast Center will follow up with her within the next couple weeks with results of her mammogram by letter or phone. Lisa [name] verbalized understanding.  [name], [name], RN 3:08 PM

## 2021-03-11 NOTE — Progress Notes (Signed)
[name] is a 50 y.o. female who presents to [facility] clinic today with no complaints.    Pap Smear: Pap smear not completed today. Last Pap smear was 10/05/2020 at the free cervical cancer screening clinic and was normal with negative HPV. Patient has history of an abnormal Pap smear 09/30/2019 that was ascus with negative HPV. Last Pap smear result is available in Epic.   Physical exam: Breasts Breasts symmetrical. No skin abnormalities bilateral breasts. No nipple retraction bilateral breasts. No nipple discharge bilateral breasts. No lymphadenopathy. No lumps palpated bilateral breasts. No complaints of pain or tenderness on exam.  MS DIGITAL DIAG TOMO BILAT  Result Date: 03/10/2020 CLINICAL DATA:  50 year old female presenting for baseline exam. Patient had one episode of pain in the right breast approximately 2 months ago that has not recurred. EXAM: DIGITAL DIAGNOSTIC BILATERAL MAMMOGRAM WITH TOMO AND CAD COMPARISON:  None. ACR Breast Density Category b: There are scattered areas of fibroglandular density. FINDINGS: Right breast: No suspicious mass, distortion, or microcalcifications are identified to suggest presence of malignancy. Left breast: No suspicious mass, distortion, or microcalcifications are identified to suggest presence of malignancy. Mammographic images were processed with CAD. IMPRESSION: No mammographic evidence of malignancy in the bilateral breasts or other finding to explain the patient's episode of right breast pain. RECOMMENDATION: 1.  Clinical follow-up as needed if the right breast pain returns. 2.  Screening mammogram in one year.(Code:SM-B-01Y) I have discussed the findings and recommendations with the patient. If applicable, a reminder letter will be sent to the patient regarding the next appointment. BI-RADS CATEGORY  1: Negative. Electronically Signed   By: Emmaline Kluver M.D.   On: 03/10/2020 16:00       Pelvic/Bimanual Pap is not indicated today per [facility]  guidelines.   Smoking History: Patient has never smoked.   Patient Navigation: Patient education provided. Access to services provided for patient through [place] program. Spanish interpreter Natale Lay from [facility] provided.   Colorectal Cancer Screening: Per patient has never had colonoscopy completed. Patient completed a FIT Test 10/01/2020 that was negative. No complaints today.    Breast and Cervical Cancer Risk Assessment: Patient does not have family history of breast cancer, known genetic mutations, or radiation treatment to the chest before age 30. Patient does not have history of cervical dysplasia, immunocompromised, or DES exposure in-utero.  Risk Assessment     Risk Scores       03/11/2021 03/10/2020   Last edited by: Meryl Dare, CMA Meryl Dare, CMA   5-year risk: 0.6 % 0.6 %   Lifetime risk: 6.2 % 6.3 %            A: [facility] exam without pap smear No complaints.  P: Referred patient to the [facility] for a screening mammogram on mobile unit. Appointment scheduled Thursday, March 11, 2021 at 1550.  Priscille Heidelberg, RN 03/11/2021 3:08 PM

## 2021-03-24 ENCOUNTER — Ambulatory Visit: Payer: Self-pay | Attending: Nurse Practitioner | Admitting: Nurse Practitioner

## 2021-03-24 ENCOUNTER — Encounter: Payer: Self-pay | Admitting: Nurse Practitioner

## 2021-03-24 ENCOUNTER — Other Ambulatory Visit: Payer: Self-pay

## 2021-03-24 DIAGNOSIS — K5909 Other constipation: Secondary | ICD-10-CM

## 2021-03-24 NOTE — Progress Notes (Signed)
Virtual Visit via Telephone Note Due to national recommendations of social distancing due to COVID 19, telehealth visit is felt to be most appropriate for this patient at this time.  I discussed the limitations, risks, security and privacy concerns of performing an evaluation and management service by telephone and the availability of in person appointments. I also discussed with the patient that there may be a patient responsible charge related to this service. The patient expressed understanding and agreed to proceed.    I connected with [name] on 03/24/21  at   1:30 PM EDT  EDT by telephone and verified that I am speaking with the correct person using two identifiers.  Location of Patient: Private Residence   Location of Provider: [facility] [employer] [facility]    Persons participating in Telemedicine visit: [name] FNP-BC [facility] W[facility]  Spanish Interpreter ID [id number]   History of Present Illness: Telemedicine visit for: Constipation  She was treated for constipation several weeks ago with senna. Associated symptoms at that time included early satiety. Today she reports symptoms have improved with taking senna as prescribed. She denies N/V/D or abdominal pain.     Past Medical History:  Diagnosis Date   Anxiety    Arthritis    Depression    Hypertension    Migraine     No past surgical history on file.  Family History  Problem Relation Age of Onset   Diabetes Mother    Hypertension Mother    Hypertension Father     Social History   Socioeconomic History   Marital status: Single    Spouse name: Not on file   Number of children: Not on file   Years of education: Not on file   Highest education level: 7th grade  Occupational History   Not on file  Tobacco Use   Smoking status: Never   Smokeless tobacco: Never  Vaping Use   Vaping Use: Never used  Substance and Sexual Activity   Alcohol use: No   Drug use: No   Sexual activity: Not  Currently    Birth control/protection: None  Other Topics Concern   Not on file  Social History Narrative   Lives with 3 children (2 sons-[name] and [name], 1 daughter Justine Null). Employed   Social Determinants of Corporate investment banker Strain: Not on file  Food Insecurity: No Food Insecurity   Worried About Programme researcher, broadcasting/film/video in the Last Year: Never true   Ran Out of Food in the Last Year: Never true  Transportation Needs: No Transportation Needs   Lack of Transportation (Medical): No   Lack of Transportation (Non-Medical): No  Physical Activity: Not on file  Stress: Not on file  Social Connections: Not on file     Observations/Objective: Awake, alert and oriented x 3   Review of Systems  Constitutional:  Negative for fever, malaise/fatigue and weight loss.  HENT: Negative.  Negative for nosebleeds.   Eyes: Negative.  Negative for blurred vision, double vision and photophobia.  Respiratory: Negative.  Negative for cough and shortness of breath.   Cardiovascular: Negative.  Negative for chest pain, palpitations and leg swelling.  Gastrointestinal: Negative.  Negative for heartburn, nausea and vomiting.  Musculoskeletal: Negative.  Negative for myalgias.  Neurological: Negative.  Negative for dizziness, focal weakness, seizures and headaches.  Psychiatric/Behavioral: Negative.  Negative for suicidal ideas.    Assessment and Plan: Diagnoses and all orders for this visit:  Chronic constipation Continue  senna as prescribed Include more fruits and leafy vegetables in diet Increase daily intake of water to at least 64 oz    Follow Up Instructions Return in about 3 months (around 06/23/2021) for HTN.     I discussed the assessment and treatment plan with the patient. The patient was provided an opportunity to ask questions and all were answered. The patient agreed with the plan and demonstrated an understanding of the instructions.   The patient was advised  to call back or seek an in-person evaluation if the symptoms worsen or if the condition fails to improve as anticipated.  I provided 7 minutes of non-face-to-face time during this encounter including median intraservice time, reviewing previous notes, labs, imaging, medications and explaining diagnosis and management.  Claiborne Rigg, FNP-BC

## 2021-06-07 ENCOUNTER — Other Ambulatory Visit: Payer: Self-pay

## 2021-06-07 ENCOUNTER — Ambulatory Visit: Payer: Self-pay | Attending: Nurse Practitioner | Admitting: Nurse Practitioner

## 2021-06-07 ENCOUNTER — Encounter: Payer: Self-pay | Admitting: Nurse Practitioner

## 2021-06-07 VITALS — BP 123/78 | HR 64 | Ht 60.0 in | Wt 179.8 lb

## 2021-06-07 DIAGNOSIS — J309 Allergic rhinitis, unspecified: Secondary | ICD-10-CM

## 2021-06-07 DIAGNOSIS — K5909 Other constipation: Secondary | ICD-10-CM

## 2021-06-07 DIAGNOSIS — F32A Depression, unspecified: Secondary | ICD-10-CM

## 2021-06-07 DIAGNOSIS — Z23 Encounter for immunization: Secondary | ICD-10-CM

## 2021-06-07 DIAGNOSIS — R7303 Prediabetes: Secondary | ICD-10-CM

## 2021-06-07 DIAGNOSIS — R5383 Other fatigue: Secondary | ICD-10-CM

## 2021-06-07 MED ORDER — SENNOSIDES-DOCUSATE SODIUM 8.6-50 MG PO TABS
2.0000 | ORAL_TABLET | Freq: Two times a day (BID) | ORAL | 3 refills | Status: AC
  Filled 2021-06-07: qty 120, 30d supply, fill #0

## 2021-06-07 MED ORDER — ESCITALOPRAM OXALATE 10 MG PO TABS
10.0000 mg | ORAL_TABLET | Freq: Every day | ORAL | 1 refills | Status: DC
  Filled 2021-06-07: qty 30, 30d supply, fill #0

## 2021-06-07 MED ORDER — FLUTICASONE PROPIONATE 50 MCG/ACT NA SUSP
2.0000 | Freq: Every day | NASAL | 6 refills | Status: DC
  Filled 2021-06-07: qty 16, 30d supply, fill #0

## 2021-06-07 NOTE — Progress Notes (Signed)
Assessment & Plan:  Lisa [name] was seen today for hypertension.  Diagnoses and all orders for this visit:  Prediabetes -     Hemoglobin A1c -     CMP14+EGFR  Depressive disorder -     escitalopram (LEXAPRO) 10 MG tablet; Take 1 tablet (10 mg total) by mouth daily.  Fatigue due to depression -     CBC  Need for immunization against influenza -     Flu Vaccine QUAD 29moIM (Fluarix, Fluzone & Alfiuria Quad PF)  Allergic rhinitis, unspecified seasonality, unspecified trigger -     fluticasone (FLONASE) 50 MCG/ACT nasal spray; PLACE 2 SPRAYS INTO BOTH NOSTRILS DAILY.  Chronic constipation -     senna-docusate (SENOKOT-S) 8.6-50 MG tablet; Take 2 tablets by mouth 2 (two) times daily.   Patient has been counseled on age-appropriate routine health concerns for screening and prevention. These are reviewed and up-to-date. Referrals have been placed accordingly. Immunizations are up-to-date or declined.    Subjective:   Chief Complaint  Patient presents with   Hypertension   HPI Lisa [name] 50y.o. female presents to office today for HTN and Depression  HTN Well controlled. She has not taken her taking losartan 50 mg in over a month. Will have her return in a few weeks for BP check while she continues off losartan. BP Readings from Last 3 Encounters:  06/07/21 123/78  03/11/21 140/88  02/24/21 126/81      Depression She has decreased energy. 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 She denies insomnia or difficulty sleeping. Has worked there for 20 years. She would like to resume her Lexapro 20 mg.  Depression screen Lisa [name] 02/24/2021  Decreased Interest 3  Down, Depressed, Hopeless 2  PHQ - 2 Score 5  Altered sleeping 2  Tired, decreased energy 3  Change in appetite 0  Feeling bad or failure about yourself  0  Trouble concentrating 3  Moving slowly or fidgety/restless 0  Suicidal thoughts 0  PHQ-9 Score 13  Some recent  data might be hidden      GAD 7 : Generalized Anxiety Score 02/24/2021 07/24/2018 05/10/2017 12/28/2016  Nervous, Anxious, on Edge 0 1 1 2   Control/stop worrying 0 0 1 1  Worry too much - different things 0 0 1 0  Trouble relaxing 2 2 3 3   Restless 0 0 0 0  Easily annoyed or irritable 0 0 0 0  Afraid - awful might happen 2 0 2 1  Total GAD 7 Score 4 3 8 7      Review of Systems  Constitutional:  Positive for malaise/fatigue. Negative for fever and weight loss.  HENT: Negative.  Negative for nosebleeds.   Eyes: Negative.  Negative for blurred vision, double vision and photophobia.  Respiratory: Negative.  Negative for cough and shortness of breath.   Cardiovascular: Negative.  Negative for chest pain, palpitations and leg swelling.  Gastrointestinal:  Positive for constipation. Negative for abdominal pain, blood in stool, diarrhea, heartburn, melena, nausea and vomiting.  Musculoskeletal: Negative.  Negative for myalgias.  Neurological: Negative.  Negative for dizziness, focal weakness, seizures and headaches.  Endo/Heme/Allergies:  Positive for environmental allergies.  Psychiatric/Behavioral:  Positive for depression. Negative for suicidal ideas.    Past Medical History:  Diagnosis Date   Anxiety    Arthritis    Depression    Hypertension    Migraine     History reviewed. No pertinent  surgical history.  Family History  Problem Relation Age of Onset   Diabetes Mother    Hypertension Mother    Hypertension Father     Social History Reviewed with no changes to be made today.   Outpatient Medications Prior to Visit  Medication Sig Dispense Refill   losartan (COZAAR) 50 MG tablet TAKE 1 TABLET (50 MG TOTAL) BY MOUTH DAILY. 90 tablet 1   pravastatin (PRAVACHOL) 10 MG tablet TAKE 1 TABLET (10 MG TOTAL) BY MOUTH DAILY. 90 tablet 1   valACYclovir (VALTREX) 500 MG tablet Take 1 tablet (500 mg total) by mouth daily. 90 tablet 1   escitalopram (LEXAPRO) 10 MG tablet Take 1 tablet  (10 mg total) by mouth daily. 30 tablet 1   fluticasone (FLONASE) 50 MCG/ACT nasal spray PLACE 2 SPRAYS INTO BOTH NOSTRILS DAILY. 16 g 6   gabapentin (NEURONTIN) 300 MG capsule Take 1 capsule (300 mg total) by mouth 3 (three) times daily. 90 capsule 1   No facility-administered medications prior to visit.    No Known Allergies     Objective:    BP 123/78   Pulse 64   Ht 5' (1.524 m)   Wt 179 lb 12.8 oz (81.6 kg)   SpO2 99%   BMI 35.11 kg/m  Wt Readings from Last 3 Encounters:  06/07/21 179 lb 12.8 oz (81.6 kg)  03/11/21 170 lb 6.4 oz (77.3 kg)  02/24/21 169 lb 9.6 oz (76.9 kg)    Physical Exam Vitals and nursing note reviewed.  Constitutional:      Appearance: She is well-developed.  HENT:     Head: Normocephalic and atraumatic.  Cardiovascular:     Rate and Rhythm: Normal rate and regular rhythm.     Heart sounds: Normal heart sounds. No murmur heard.   No friction rub. No gallop.  Pulmonary:     Effort: Pulmonary effort is normal. No tachypnea or respiratory distress.     Breath sounds: Normal breath sounds. No decreased breath sounds, wheezing, rhonchi or rales.  Chest:     Chest wall: No tenderness.  Abdominal:     General: Bowel sounds are normal.     Palpations: Abdomen is soft.  Musculoskeletal:        General: Normal range of motion.     Cervical back: Normal range of motion.  Skin:    General: Skin is warm and dry.  Neurological:     Mental Status: She is alert and oriented to person, place, and time.     Coordination: Coordination normal.  Psychiatric:        Behavior: Behavior normal. Behavior is cooperative.        Thought Content: Thought content normal.        Judgment: Judgment normal.         Patient has been counseled extensively about nutrition and exercise as well as the importance of adherence with medications and regular follow-up. The patient was given clear instructions to go to ER or return to medical center if symptoms don't  improve, worsen or new problems develop. The patient verbalized understanding.   Follow-up: Return for 2 week BP check with [facility]. TELE   TUESDAY with me 4 weeks.   Lisa Pounds, FNP-BC [facility] and [place], [place]   06/07/2021, 10:19 PM

## 2021-06-07 NOTE — Progress Notes (Signed)
Depression

## 2021-06-08 LAB — CBC
Hematocrit: 42.5 % (ref 34.0–46.6)
Hemoglobin: 14.3 g/dL (ref 11.1–15.9)
MCH: 30.3 pg (ref 26.6–33.0)
MCHC: 33.6 g/dL (ref 31.5–35.7)
MCV: 90 fL (ref 79–97)
Platelets: 292 10*3/uL (ref 150–450)
RBC: 4.72 x10E6/uL (ref 3.77–5.28)
RDW: 12.6 % (ref 11.7–15.4)
WBC: 6.9 10*3/uL (ref 3.4–10.8)

## 2021-06-08 LAB — CMP14+EGFR
ALT: 23 IU/L (ref 0–32)
AST: 19 IU/L (ref 0–40)
Albumin/Globulin Ratio: 1.7 (ref 1.2–2.2)
Albumin: 4.5 g/dL (ref 3.8–4.8)
Alkaline Phosphatase: 107 IU/L (ref 44–121)
BUN/Creatinine Ratio: 23 (ref 9–23)
BUN: 13 mg/dL (ref 6–24)
Bilirubin Total: 0.2 mg/dL (ref 0.0–1.2)
CO2: 26 mmol/L (ref 20–29)
Calcium: 9.6 mg/dL (ref 8.7–10.2)
Chloride: 104 mmol/L (ref 96–106)
Creatinine, Ser: 0.56 mg/dL — ABNORMAL LOW (ref 0.57–1.00)
Globulin, Total: 2.7 g/dL (ref 1.5–4.5)
Glucose: 95 mg/dL (ref 70–99)
Potassium: 4.7 mmol/L (ref 3.5–5.2)
Sodium: 143 mmol/L (ref 134–144)
Total Protein: 7.2 g/dL (ref 6.0–8.5)
eGFR: 113 mL/min/{1.73_m2} (ref >59)

## 2021-06-08 LAB — HEMOGLOBIN A1C
Est. average glucose Bld gHb Est-mCnc: 120 mg/dL
Hgb A1c MFr Bld: 5.8 % — ABNORMAL HIGH (ref 4.8–5.6)

## 2021-06-10 ENCOUNTER — Telehealth: Payer: Self-pay

## 2021-06-10 NOTE — Telephone Encounter (Signed)
-----   Message from Claiborne Rigg, NP sent at 06/10/2021  9:00 AM EST ----- A1c still in prediabetes range.  Kidney, liver function and electrolytes are normal.   CBC does not indicate any anemia or bleeding disorders

## 2021-06-10 NOTE — Telephone Encounter (Signed)
Left message to return call to our office.  With help of interpretor Vernona Rieger # [phone].

## 2021-06-21 ENCOUNTER — Ambulatory Visit: Payer: No Typology Code available for payment source | Admitting: Pharmacist

## 2021-07-06 ENCOUNTER — Ambulatory Visit: Payer: Self-pay | Attending: Nurse Practitioner | Admitting: Nurse Practitioner

## 2021-07-06 ENCOUNTER — Other Ambulatory Visit: Payer: Self-pay

## 2021-07-06 ENCOUNTER — Encounter: Payer: Self-pay | Admitting: Nurse Practitioner

## 2021-07-06 DIAGNOSIS — F331 Major depressive disorder, recurrent, moderate: Secondary | ICD-10-CM

## 2021-07-06 MED ORDER — ESCITALOPRAM OXALATE 20 MG PO TABS
20.0000 mg | ORAL_TABLET | Freq: Every day | ORAL | 1 refills | Status: DC
  Filled 2021-07-06: qty 90, 90d supply, fill #0
  Filled 2021-07-12: qty 30, 30d supply, fill #0

## 2021-07-06 NOTE — Progress Notes (Signed)
Virtual Visit via Telephone Note Due to national recommendations of social distancing due to COVID 19, telehealth visit is felt to be most appropriate for this patient at this time.  I discussed the limitations, risks, security and privacy concerns of performing an evaluation and management service by telephone and the availability of in person appointments. I also discussed with the patient that there may be a patient responsible charge related to this service. The patient expressed understanding and agreed to proceed.    I connected with Lisa [name] on 07/06/21  at   3:30 PM EST  EDT by telephone and verified that I am speaking with the correct person using two identifiers.  Location of Patient: Private Residence   [facility] of Provider: [facility]    Persons participating in Telemedicine visit: [name] FNP-BC Tynleigh Birt    History of Present Illness: Telemedicine visit for: Depression  She was started on Lexapro 10mg  on December. 5th. We are increasing lexapro to 20 mg today as she does not feel her depression has improved and her PHQ9 score has not decreased.   Depression screen [facility] 2/9 07/06/2021 02/24/2021 03/25/2020  Decreased Interest 3 3 1   Down, Depressed, Hopeless 3 2 3   PHQ - 2 Score 6 5 4   Altered sleeping 0 2 0  Tired, decreased energy 3 3 3   Change in appetite 0 0 0  Feeling bad or failure about yourself  1 0 0  Trouble concentrating 3 3 0  Moving slowly or fidgety/restless 0 0 0  Suicidal thoughts 0 0 0  PHQ-9 Score 13 13 7   Difficult doing work/chores Somewhat difficult - -  Some recent data might be hidden     Past Medical History:  Diagnosis Date   Anxiety    Arthritis    Depression    Hypertension    Migraine     History reviewed. No pertinent surgical history.  Family History  Problem Relation Age of Onset   Diabetes Mother    Hypertension Mother    Hypertension Father     Social History    Socioeconomic History   Marital status: Single    Spouse name: Not on file   Number of children: Not on file   Years of education: Not on file   Highest education level: 7th grade  Occupational History   Not on file  Tobacco Use   Smoking status: Never   Smokeless tobacco: Never  Vaping Use   Vaping Use: Never used  Substance and Sexual Activity   Alcohol use: No   Drug use: No   Sexual activity: Not Currently    Birth control/protection: None  Other Topics Concern   Not on file  Social History Narrative   Lives with 3 children (2 sons-[name] and 03/27/2020, 1 daughter ). Employed   Social Determinants of Strain: Not on file  Food Insecurity: No Food Insecurity   Worried About in the Last Year: Never true   Ran Out of Food in the Last Year: Never true  Transportation Needs: No Transportation Needs   Lack of Transportation (Medical): No   Lack of Transportation (Non-Medical): No  Physical Activity: Not on file  Stress: Not on file  Social Connections: Not on file     Observations/Objective: Awake, alert and oriented x 3   Review of Systems  Constitutional:  Negative for fever, malaise/fatigue and weight loss.  HENT:  Negative.  Negative for nosebleeds.   Eyes: Negative.  Negative for blurred vision, double vision and photophobia.  Respiratory: Negative.  Negative for cough and shortness of breath.   Cardiovascular: Negative.  Negative for chest pain, palpitations and leg swelling.  Gastrointestinal: Negative.  Negative for heartburn, nausea and vomiting.  Musculoskeletal: Negative.  Negative for myalgias.  Neurological: Negative.  Negative for dizziness, focal weakness, seizures and headaches.  Psychiatric/Behavioral:  Positive for depression. Negative for suicidal ideas.    Assessment and Plan: Diagnoses and all orders for this visit:  Moderate episode of recurrent major depressive disorder  (HCC) -     escitalopram (LEXAPRO) 20 MG tablet; Take 1 tablet (20 mg total) by mouth daily. Lexapro increased from 10 mg to 20mg      Follow Up Instructions Return in about 4 weeks (around 08/03/2021) for TELE DEPRESSION .     I discussed the assessment and treatment plan with the patient. The patient was provided an opportunity to ask questions and all were answered. The patient agreed with the plan and demonstrated an understanding of the instructions.   The patient was advised to call back or seek an in-person evaluation if the symptoms worsen or if the condition fails to improve as anticipated.  I provided 13 minutes of non-face-to-face time during this encounter including median intraservice time, reviewing previous notes, labs, imaging, medications and explaining diagnosis and management.  08/05/2021, FNP-BC

## 2021-07-12 ENCOUNTER — Other Ambulatory Visit: Payer: Self-pay

## 2021-07-13 ENCOUNTER — Other Ambulatory Visit: Payer: Self-pay

## 2021-07-13 ENCOUNTER — Ambulatory Visit: Payer: No Typology Code available for payment source | Admitting: Pharmacist

## 2021-07-28 ENCOUNTER — Ambulatory Visit: Payer: Self-pay | Admitting: Pharmacist

## 2021-08-03 ENCOUNTER — Encounter: Payer: Self-pay | Admitting: Nurse Practitioner

## 2021-08-03 ENCOUNTER — Ambulatory Visit: Payer: Self-pay | Attending: Nurse Practitioner | Admitting: Nurse Practitioner

## 2021-08-03 ENCOUNTER — Other Ambulatory Visit: Payer: Self-pay

## 2021-08-03 DIAGNOSIS — F331 Major depressive disorder, recurrent, moderate: Secondary | ICD-10-CM

## 2021-08-03 NOTE — Progress Notes (Signed)
Virtual Visit via Telephone Note Due to national recommendations of social distancing due to [place] 19, telehealth visit is felt to be most appropriate for this patient at this time.  I discussed the limitations, risks, security and privacy concerns of performing an evaluation and management service by telephone and the availability of in person appointments. I also discussed with the patient that there may be a patient responsible charge related to this service. The patient expressed understanding and agreed to proceed.    I connected with Lisa [name] on 08/03/21  at   4:10 PM EST  EDT by telephone and verified that I am speaking with the correct person using two identifiers.  Location of Patient: Private Residence   Location of Provider: [place] [facility] [place] participating in Telemedicine visit: Geryl Rankins FNP-BC Lisa [name]    History of Present Illness: Telemedicine visit for: Depression   Her Lexapro was increased from 20 mg several weeks ago due to depression and anxiety. Today PHQ9 scores have improved. She does endorse improvement in her mood. At this time we will continue on her current dose of lexapro.   Depression screen [name] 2/9 08/03/2021 07/06/2021 02/24/2021  Decreased Interest 0 3 3  Down, Depressed, Hopeless 1 3 2   PHQ - 2 Score 1 6 5   Altered sleeping 0 0 2  Tired, decreased energy 3 3 3   Change in appetite 1 0 0  Feeling bad or failure about yourself  0 1 0  Trouble concentrating 2 3 3   Moving slowly or fidgety/restless 0 0 0  Suicidal thoughts 0 0 0  PHQ-9 Score 7 13 13   Difficult doing work/chores Somewhat difficult Somewhat difficult -  Some recent data might be hidden     Past Medical History:  Diagnosis Date   Anxiety    Arthritis    Depression    Hypertension    Migraine     History reviewed. No pertinent surgical history.  Family History  Problem Relation Age of Onset   Diabetes Mother    Hypertension  Mother    Hypertension Father     Social History   Socioeconomic History   Marital status: Single    Spouse name: Not on file   Number of children: Not on file   Years of education: Not on file   Highest education level: 7th grade  Occupational History   Not on file  Tobacco Use   Smoking status: Never   Smokeless tobacco: Never  Vaping Use   Vaping Use: Never used  Substance and Sexual Activity   Alcohol use: No   Drug use: No   Sexual activity: Not Currently    Birth control/protection: None  Other Topics Concern   Not on file  Social History Narrative   Lives with 3 children (2 sons-Lisa [name] and Lisa [name], 1 daughter Lisa [name]). Employed   Social Determinants of Radio broadcast assistant Strain: Not on file  Food Insecurity: No Food Insecurity   Worried About Charity fundraiser in the Last Year: Never true   Ran Out of Food in the Last Year: Never true  Transportation Needs: No Transportation Needs   Lack of Transportation (Medical): No   Lack of Transportation (Non-Medical): No  Physical Activity: Not on file  Stress: Not on file  Social Connections: Not on file     Observations/Objective: Awake, alert and oriented x 3   Review of Systems  Constitutional:  Negative  for fever, malaise/fatigue and weight loss.  HENT: Negative.  Negative for nosebleeds.   Eyes: Negative.  Negative for blurred vision, double vision and photophobia.  Respiratory: Negative.  Negative for cough and shortness of breath.   Cardiovascular: Negative.  Negative for chest pain, palpitations and leg swelling.  Gastrointestinal: Negative.  Negative for heartburn, nausea and vomiting.  Musculoskeletal: Negative.  Negative for myalgias.  Neurological: Negative.  Negative for dizziness, focal weakness, seizures and headaches.  Psychiatric/Behavioral:  Positive for depression. Negative for suicidal ideas. The patient is nervous/anxious.    Assessment and Plan: Diagnoses and all  orders for this visit:  Moderate episode of recurrent major depressive disorder ([place]) Continue lexapro as instructed.     Follow Up Instructions Return if symptoms worsen or fail to improve.     I discussed the assessment and treatment plan with the patient. The patient was provided an opportunity to ask questions and all were answered. The patient agreed with the plan and demonstrated an understanding of the instructions.   The patient was advised to call back or seek an in-person evaluation if the symptoms worsen or if the condition fails to improve as anticipated.  I provided 12 minutes of non-face-to-face time during this encounter including median intraservice time, reviewing previous notes, labs, imaging, medications and explaining diagnosis and management.  Gildardo Pounds, FNP-BC

## 2021-08-04 ENCOUNTER — Ambulatory Visit: Payer: Self-pay | Admitting: Pharmacist

## 2021-08-11 ENCOUNTER — Ambulatory Visit: Payer: Self-pay | Attending: Nurse Practitioner | Admitting: Pharmacist

## 2021-08-11 ENCOUNTER — Encounter: Payer: Self-pay | Admitting: Pharmacist

## 2021-08-11 VITALS — BP 125/79 | HR 62

## 2021-08-11 DIAGNOSIS — I10 Essential (primary) hypertension: Secondary | ICD-10-CM

## 2021-08-11 NOTE — Progress Notes (Signed)
° °  S:    PCP: Zelda   Patient arrives in good spirits. Presents to the clinic for hypertension evaluation, counseling, and management. Patient was referred and last seen by Primary Care Provider on 06/07/2021. BP was 123/78 off of losartan. Pt was told to hold losartan.  Medication adherence: not currently taking losartan as instructed.  Current BP Medications include:  losartan 50 mg daily (holding)   Dietary habits include: compliant with sodium restriction. Drinks coffee all throughout the day.  Exercise habits include: none  Family / Social history:  FHX: DM, HTN Tobacco: never smoker  Alcohol: denies use   O:  Vitals:   08/11/21 1603  BP: 125/79  Pulse: 62   Home BP readings: none   Last 3 Office BP readings: BP Readings from Last 3 Encounters:  08/11/21 125/79  06/07/21 123/78  03/11/21 140/88    BMET    Component Value Date/Time   NA 143 06/07/2021 1622   K 4.7 06/07/2021 1622   CL 104 06/07/2021 1622   CO2 26 06/07/2021 1622   GLUCOSE 95 06/07/2021 1622   GLUCOSE 109 (H) 10/04/2019 2234   BUN 13 06/07/2021 1622   CREATININE 0.56 (L) 06/07/2021 1622   CREATININE 0.53 09/01/2016 1635   CALCIUM 9.6 06/07/2021 1622   GFRNONAA 110 06/30/2020 1615   GFRAA 127 06/30/2020 1615    Renal function: CrCl cannot be calculated (Patient's most recent lab result is older than the maximum 21 days allowed.).  Clinical ASCVD: No  The 10-year ASCVD risk score (Arnett DK, et al., 2019) is: 1.3%   Values used to calculate the score:     Age: 51 years     Sex: Female     Is Non-Hispanic African American: No     Diabetic: No     Tobacco smoker: No     Systolic Blood Pressure: 0000000 mmHg     Is BP treated: Yes     HDL Cholesterol: 59 mg/dL     Total Cholesterol: 206 mg/dL   A/P: Hypertension longstanding currently controlled off of medications. BP Goal = < 130/80 mmHg. No need to restart losartan at this time.   -Counseled on lifestyle modifications for blood pressure  control including reduced dietary sodium, increased exercise, adequate sleep.  Results reviewed and written information provided.   Total time in face-to-face counseling 20 minutes.   F/U Clinic Visit in April with PCP.   Benard Halsted, PharmD, [facility], [place] [phone]

## 2021-10-06 ENCOUNTER — Ambulatory Visit: Payer: Self-pay | Attending: Nurse Practitioner | Admitting: Nurse Practitioner

## 2021-10-06 ENCOUNTER — Other Ambulatory Visit: Payer: Self-pay

## 2021-10-06 ENCOUNTER — Encounter: Payer: Self-pay | Admitting: Nurse Practitioner

## 2021-10-06 VITALS — BP 131/82 | HR 67 | Wt 178.0 lb

## 2021-10-06 DIAGNOSIS — R519 Headache, unspecified: Secondary | ICD-10-CM

## 2021-10-06 DIAGNOSIS — I10 Essential (primary) hypertension: Secondary | ICD-10-CM

## 2021-10-06 DIAGNOSIS — G629 Polyneuropathy, unspecified: Secondary | ICD-10-CM

## 2021-10-06 DIAGNOSIS — R7303 Prediabetes: Secondary | ICD-10-CM

## 2021-10-06 DIAGNOSIS — M25552 Pain in left hip: Secondary | ICD-10-CM

## 2021-10-06 DIAGNOSIS — J309 Allergic rhinitis, unspecified: Secondary | ICD-10-CM

## 2021-10-06 DIAGNOSIS — E785 Hyperlipidemia, unspecified: Secondary | ICD-10-CM

## 2021-10-06 MED ORDER — FLUTICASONE PROPIONATE 50 MCG/ACT NA SUSP
2.0000 | Freq: Every day | NASAL | 6 refills | Status: DC
  Filled 2021-10-06: qty 16, 25d supply, fill #0

## 2021-10-06 MED ORDER — VALACYCLOVIR HCL 500 MG PO TABS
500.0000 mg | ORAL_TABLET | Freq: Every day | ORAL | 1 refills | Status: AC
  Filled 2021-10-06: qty 30, 30d supply, fill #0

## 2021-10-06 MED ORDER — GABAPENTIN 100 MG PO CAPS
100.0000 mg | ORAL_CAPSULE | Freq: Three times a day (TID) | ORAL | 0 refills | Status: DC
Start: 2021-10-06 — End: 2021-11-02
  Filled 2021-10-06: qty 90, 30d supply, fill #0

## 2021-10-06 NOTE — Progress Notes (Signed)
F/u HTN ?No longer taking BP medication. States was told to stop  ? ?Left side/ back pain with walking x 2 days  ?5/10 ?Fall at home and  work. Seeing specialist.   ? ?Not taking lexapro but states she is doing better in that area.  ?

## 2021-10-06 NOTE — Progress Notes (Signed)
? ?Assessment & Plan:  ?Lisa [name] was seen today for hypertension. ? ?Diagnoses and all orders for this visit: ? ?Primary hypertension ?-     CMP14+EGFR ? ?Left hip pain ?-     gabapentin (NEURONTIN) 100 MG capsule; Take 1 capsule (100 mg total) by mouth 3 (three) times daily. Left hip pain ? ?Prediabetes ?-     Hemoglobin A1c ? ?Dyslipidemia, goal LDL below 70 ?-     Lipid panel ? ?Allergic rhinitis, unspecified seasonality, unspecified trigger ?-     fluticasone (FLONASE) 50 MCG/ACT nasal spray; PLACE 2 SPRAYS INTO BOTH NOSTRILS DAILY. ? ?Neuropathy ?-     valACYclovir (VALTREX) 500 MG tablet; Take 1 tablet (500 mg total) by mouth daily. ? ? ? ? ? ?Patient has been counseled on age-appropriate routine health concerns for screening and prevention. These are reviewed and up-to-date. Referrals have been placed accordingly. Immunizations are up-to-date or declined.    ?Subjective:  ? ?Chief Complaint  ?Patient presents with  ? Hypertension  ? ?HPI ?Lisa [name] 51 y.o. female presents to office today for follow up to HTN ? ?VRI was used to communicate directly with patient for the entire encounter including providing detailed patient instructions.   ? ?She has a past medical history of Anxiety, Arthritis, Depression, Hypertension, and Migraine.  ? ?HTN ?She stopped taking losartan several months ago on her own.  ?BP Readings from Last 3 Encounters:  ?10/06/21 131/82  ?08/11/21 125/79  ?06/07/21 123/78  ?  ?Prediabetes ?Well controlled without the use of any diabetic medications at this time. LDL not at goal. She is not taking pravastatin as prescribed.  ?Lab Results  ?Component Value Date  ? HGBA1C 5.9 (H) 10/06/2021  ? ?Lab Results  ?Component Value Date  ? LDLCALC 133 (H) 10/06/2021  ? ? ?Hip Pain ?Onset 2 days ago. Prior to 2 days ago she had hip pain 1 week earlier that only lasted for a few days.  States she has fallen several times at work.  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  Pain is described as aching and soreness. Denies numbness or sciatica in BLE.  ? ?Review of Systems  ?Constitutional:  Negative for fever, malaise/fatigue and weight loss.  ?HENT:  Positive for congestion. Negative for nosebleeds.   ?Eyes: Negative.  Negative for blurred vision, double vision and photophobia.  ?Respiratory: Negative.  Negative for cough and shortness of breath.   ?Cardiovascular: Negative.  Negative for chest pain, palpitations and leg swelling.  ?Gastrointestinal: Negative.  Negative for heartburn, nausea and vomiting.  ?Genitourinary: Negative.   ?Musculoskeletal:  Positive for joint pain. Negative for myalgias.  ?Neurological:  Positive for sensory change. Negative for dizziness, focal weakness, seizures and headaches.  ?Psychiatric/Behavioral: Negative.  Negative for suicidal ideas.   ? ?Past Medical History:  ?Diagnosis Date  ? Anxiety   ? Arthritis   ? Depression   ? Hypertension   ? Migraine   ? ? ?History reviewed. No pertinent surgical history. ? ?Family History  ?Problem Relation Age of Onset  ? Diabetes Mother   ? Hypertension Mother   ? Hypertension Father   ? ? ?Social History Reviewed with no changes to be made today.  ? ?Outpatient Medications Prior to Visit  ?Medication Sig Dispense Refill  ? fluticasone (FLONASE) 50 MCG/ACT  nasal spray PLACE 2 SPRAYS INTO BOTH NOSTRILS DAILY. 16 g 6  ? valACYclovir (VALTREX) 500 MG tablet Take 1 tablet (500 mg total) by mouth daily. 90 tablet 1  ? losartan (COZAAR) 50 MG tablet TAKE 1 TABLET (50 MG TOTAL) BY MOUTH DAILY. (Patient not taking: Reported on 10/06/2021) 90 tablet 1  ? pravastatin (PRAVACHOL) 10 MG tablet TAKE 1 TABLET (10 MG TOTAL) BY MOUTH DAILY. (Patient not taking: Reported on 10/06/2021) 90 tablet 1  ? escitalopram (LEXAPRO) 20 MG tablet Take 1 tablet (20 mg total) by mouth daily. 90 tablet 1  ? ?No  facility-administered medications prior to visit.  ? ? ?No Known Allergies ? ?   ?Objective:  ?  ?BP 131/82   Pulse 67   Wt 178 lb (80.7 kg)   LMP  (Approximate) Comment: 3 years ago  SpO2 96%   BMI 34.76 kg/m?  ?Wt Readings from Last 3 Encounters:  ?10/06/21 178 lb (80.7 kg)  ?06/07/21 179 lb 12.8 oz (81.6 kg)  ?03/11/21 170 lb 6.4 oz (77.3 kg)  ? ? ?Physical Exam ?Vitals and nursing note reviewed.  ?Constitutional:   ?   Appearance: She is well-developed.  ?HENT:  ?   Head: Normocephalic and atraumatic.  ?Cardiovascular:  ?   Rate and Rhythm: Normal rate and regular rhythm.  ?   Heart sounds: Normal heart sounds. No murmur heard. ?  No friction rub. No gallop.  ?Pulmonary:  ?   Effort: Pulmonary effort is normal. No tachypnea or respiratory distress.  ?   Breath sounds: Normal breath sounds. No decreased breath sounds, wheezing, rhonchi or rales.  ?Chest:  ?   Chest wall: No tenderness.  ?Abdominal:  ?   General: Bowel sounds are normal.  ?   Palpations: Abdomen is soft.  ?Musculoskeletal:     ?   General: Normal range of motion.  ?   Cervical back: Normal range of motion.  ?Skin: ?   General: Skin is warm and dry.  ?Neurological:  ?   Mental Status: She is alert and oriented to person, place, and time.  ?   Coordination: Coordination normal.  ?Psychiatric:     ?   Behavior: Behavior normal. Behavior is cooperative.     ?   Thought Content: Thought content normal.     ?   Judgment: Judgment normal.  ? ? ? ? ?   ?Patient has been counseled extensively about nutrition and exercise as well as the importance of adherence with medications and regular follow-up. The patient was given clear instructions to go to ER or return to medical center if symptoms don't improve, worsen or new problems develop. The patient verbalized understanding.  ? ?Follow-up: Return in about 4 weeks (around 11/03/2021) for VIRTUAL on TUESDAY for left hip pain in 4 weeks. .  ? ?Gildardo Pounds, FNP-BC ?[facility] ?[place], [place] ?[phone]   ?10/08/2021, 12:06 AM ?

## 2021-10-07 LAB — CMP14+EGFR
ALT: 25 IU/L (ref 0–32)
AST: 21 IU/L (ref 0–40)
Albumin/Globulin Ratio: 1.8 (ref 1.2–2.2)
Albumin: 4.6 g/dL (ref 3.8–4.8)
Alkaline Phosphatase: 112 IU/L (ref 44–121)
BUN/Creatinine Ratio: 31 — ABNORMAL HIGH (ref 9–23)
BUN: 15 mg/dL (ref 6–24)
Bilirubin Total: 0.5 mg/dL (ref 0.0–1.2)
CO2: 25 mmol/L (ref 20–29)
Calcium: 9.6 mg/dL (ref 8.7–10.2)
Chloride: 102 mmol/L (ref 96–106)
Creatinine, Ser: 0.49 mg/dL — ABNORMAL LOW (ref 0.57–1.00)
Globulin, Total: 2.6 g/dL (ref 1.5–4.5)
Glucose: 100 mg/dL — ABNORMAL HIGH (ref 70–99)
Potassium: 4.7 mmol/L (ref 3.5–5.2)
Sodium: 141 mmol/L (ref 134–144)
Total Protein: 7.2 g/dL (ref 6.0–8.5)
eGFR: 116 mL/min/{1.73_m2} (ref >59)

## 2021-10-07 LAB — LIPID PANEL
Chol/HDL Ratio: 4.2 ratio (ref 0.0–4.4)
Cholesterol, Total: 215 mg/dL — ABNORMAL HIGH (ref 100–199)
HDL: 51 mg/dL (ref >39)
LDL Chol Calc (NIH): 133 mg/dL — ABNORMAL HIGH (ref 0–99)
Triglycerides: 173 mg/dL — ABNORMAL HIGH (ref 0–149)
VLDL Cholesterol Cal: 31 mg/dL (ref 5–40)

## 2021-10-07 LAB — HEMOGLOBIN A1C
Est. average glucose Bld gHb Est-mCnc: 123 mg/dL
Hgb A1c MFr Bld: 5.9 % — ABNORMAL HIGH (ref 4.8–5.6)

## 2021-10-08 ENCOUNTER — Encounter: Payer: Self-pay | Admitting: Nurse Practitioner

## 2021-11-02 ENCOUNTER — Encounter: Payer: Self-pay | Admitting: Nurse Practitioner

## 2021-11-02 ENCOUNTER — Ambulatory Visit (HOSPITAL_BASED_OUTPATIENT_CLINIC_OR_DEPARTMENT_OTHER): Payer: No Typology Code available for payment source | Admitting: Nurse Practitioner

## 2021-11-02 ENCOUNTER — Other Ambulatory Visit: Payer: Self-pay

## 2021-11-02 DIAGNOSIS — M25552 Pain in left hip: Secondary | ICD-10-CM

## 2021-11-02 MED ORDER — MELOXICAM 7.5 MG PO TABS
7.5000 mg | ORAL_TABLET | Freq: Every day | ORAL | 0 refills | Status: DC
  Filled 2021-11-02: qty 30, 30d supply, fill #0

## 2021-11-02 NOTE — Progress Notes (Signed)
Virtual Visit via Telephone Note ? I discussed the limitations, risks, security and privacy concerns of performing an evaluation and management service by telephone and the availability of in person appointments. I also discussed with the patient that there may be a patient responsible charge related to this service. The patient expressed understanding and agreed to proceed.  ? ? ?I connected with [name] on 11/02/21  at   1:50 PM EDT  EDT by telephone and verified that I am speaking with the correct person using two identifiers. ? ?Location of Patient: ?Private Residence ?  ?Location of Provider: ?[facility] [employer] [facility]  ?  ?Persons participating in Telemedicine visit: ?[name] FNP-BC ?Hazel Sams Lovern  ?  ?History of Present Illness: ?Telemedicine visit for: LEFT HIP PAIN ? ?Onset 1 month ago.  States she has fallen several times at work.  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  Pain is described as aching and soreness. Denies numbness or sciatica in BLE. She was prescribed gabapentin last month for pain and today states she has not noticed much improvement in hip pain.  ?Past Medical History:  ?Diagnosis Date  ? Anxiety   ? Arthritis   ? Depression   ? Hypertension   ? Migraine   ?  ?No past surgical history on file.  ?Family History  ?Problem Relation Age of Onset  ? Diabetes Mother   ? Hypertension Mother   ? Hypertension Father   ?  ?Social History  ? ?Socioeconomic History  ? Marital status: Single  ?  Spouse name: Not on file  ? Number of children: Not on file  ? Years of education: Not on file  ? Highest education level: 7th grade  ?Occupational History  ? Not on file  ?Tobacco Use  ? Smoking status: Never  ? Smokeless tobacco: Never  ?Vaping Use  ? Vaping Use: Never used  ?Substance and Sexual Activity  ? Alcohol  use: No  ? Drug use: No  ? Sexual activity: Not Currently  ?  Birth control/protection: None  ?Other Topics Concern  ? Not on file  ?Social History Narrative  ? Lives with 3 children (2 sons-[name] and [name], 1 daughter Justine Null). Employed  ? ?Social Determinants of Health  ? ?Financial Resource Strain: Not on file  ?Food Insecurity: No Food Insecurity  ? Worried About Programme researcher, broadcasting/film/video in the Last Year: Never true  ? Ran Out of Food in the Last Year: Never true  ?Transportation Needs: No Transportation Needs  ? Lack of Transportation (Medical): No  ? Lack of Transportation (Non-Medical): No  ?Physical Activity: Not on file  ?Stress: Not on file  ?Social Connections: Not on file  ?  ? ?Observations/Objective: ?Awake, alert and oriented x 3 ? ? ?Review of Systems  ?Constitutional:  Negative for fever, malaise/fatigue and weight loss.  ?HENT: Negative.  Negative for nosebleeds.   ?Eyes: Negative.  Negative for blurred vision, double vision and photophobia.  ?Respiratory: Negative.  Negative for cough and shortness of breath.   ?Cardiovascular: Negative.  Negative for chest pain, palpitations and leg swelling.  ?Gastrointestinal: Negative.  Negative for heartburn, nausea and vomiting.  ?Musculoskeletal:  Positive for joint pain. Negative for myalgias.  ?Neurological: Negative.  Negative for dizziness, focal weakness, seizures and headaches.  ?  Psychiatric/Behavioral: Negative.  Negative for suicidal ideas.    ?Assessment and Plan: ?Diagnoses and all orders for this visit: ? ?Left hip pain ?-     meloxicam (MOBIC) 7.5 MG tablet; Take 1 tablet (7.5 mg total) by mouth daily. Left hip pain ?-     DG HIP UNILAT WITH PELVIS 2-3 VIEWS LEFT; Future ?Work on losing weight to help reduce joint pain. May alternate with heat and ice application for pain relief. May also alternate with acetaminophen as prescribed pain relief. Other alternatives include massage, acupuncture and water aerobics.   ? ?  ? ?Follow Up  Instructions ?Return if symptoms worsen or fail to improve.  ? ?  ?I discussed the assessment and treatment plan with the patient. The patient was provided an opportunity to ask questions and all were answered. The patient agreed with the plan and demonstrated an understanding of the instructions. ?  ?The patient was advised to call back or seek an in-person evaluation if the symptoms worsen or if the condition fails to improve as anticipated. ? ?I provided 10 minutes of non-face-to-face time during this encounter including median intraservice time, reviewing previous notes, labs, imaging, medications and explaining diagnosis and management. ? ?Claiborne Rigg, FNP-BC  ?

## 2021-11-04 ENCOUNTER — Ambulatory Visit
Admission: RE | Admit: 2021-11-04 | Discharge: 2021-11-04 | Disposition: A | Discharge status: Home / Self Care | Payer: No Typology Code available for payment source | Source: Ambulatory Visit | Attending: Nurse Practitioner | Admitting: Nurse Practitioner

## 2021-11-04 DIAGNOSIS — M25552 Pain in left hip: Secondary | ICD-10-CM

## 2022-08-26 LAB — AMB RESULTS CONSOLE CBG: Glucose: 129

## 2022-08-26 NOTE — Progress Notes (Signed)
Needs f/u appt scheduled w/Fleming, NP

## 2023-09-25 ENCOUNTER — Emergency Department (HOSPITAL_COMMUNITY)
Admission: EM | Admit: 2023-09-25 | Discharge: 2023-09-25 | Disposition: A | Discharge status: Home / Self Care | Attending: Emergency Medicine | Admitting: Emergency Medicine

## 2023-09-25 ENCOUNTER — Encounter (HOSPITAL_COMMUNITY): Payer: Self-pay | Admitting: Radiology

## 2023-09-25 ENCOUNTER — Other Ambulatory Visit: Payer: Self-pay

## 2023-09-25 ENCOUNTER — Emergency Department (HOSPITAL_COMMUNITY)

## 2023-09-25 DIAGNOSIS — R109 Unspecified abdominal pain: Secondary | ICD-10-CM | POA: Insufficient documentation

## 2023-09-25 DIAGNOSIS — M545 Low back pain, unspecified: Secondary | ICD-10-CM

## 2023-09-25 LAB — CBC WITH DIFFERENTIAL/PLATELET
Abs Immature Granulocytes: 0.03 10*3/uL (ref 0.00–0.07)
Basophils Absolute: 0 10*3/uL (ref 0.0–0.1)
Basophils Relative: 0 %
Eosinophils Absolute: 0.1 10*3/uL (ref 0.0–0.5)
Eosinophils Relative: 2 %
HCT: 41.2 % (ref 36.0–46.0)
Hemoglobin: 14 g/dL (ref 12.0–15.0)
Immature Granulocytes: 0 %
Lymphocytes Relative: 40 %
Lymphs Abs: 2.8 10*3/uL (ref 0.7–4.0)
MCH: 30.6 pg (ref 26.0–34.0)
MCHC: 34 g/dL (ref 30.0–36.0)
MCV: 90.2 fL (ref 80.0–100.0)
Monocytes Absolute: 0.6 10*3/uL (ref 0.1–1.0)
Monocytes Relative: 9 %
Neutro Abs: 3.5 10*3/uL (ref 1.7–7.7)
Neutrophils Relative %: 49 %
Platelets: 279 10*3/uL (ref 150–400)
RBC: 4.57 MIL/uL (ref 3.87–5.11)
RDW: 13.4 % (ref 11.5–15.5)
WBC: 7.1 10*3/uL (ref 4.0–10.5)
nRBC: 0 % (ref 0.0–0.2)

## 2023-09-25 LAB — COMPREHENSIVE METABOLIC PANEL
ALT: 23 U/L (ref 0–44)
AST: 24 U/L (ref 15–41)
Albumin: 4 g/dL (ref 3.5–5.0)
Alkaline Phosphatase: 78 U/L (ref 38–126)
Anion gap: 8 (ref 5–15)
BUN: 12 mg/dL (ref 6–20)
CO2: 26 mmol/L (ref 22–32)
Calcium: 9.2 mg/dL (ref 8.9–10.3)
Chloride: 106 mmol/L (ref 98–111)
Creatinine, Ser: 0.59 mg/dL (ref 0.44–1.00)
GFR, Estimated: >60 mL/min (ref >60)
Glucose, Bld: 123 mg/dL — ABNORMAL HIGH (ref 70–99)
Potassium: 4.4 mmol/L (ref 3.5–5.1)
Sodium: 140 mmol/L (ref 135–145)
Total Bilirubin: 0.7 mg/dL (ref 0.0–1.2)
Total Protein: 7.1 g/dL (ref 6.5–8.1)

## 2023-09-25 LAB — URINALYSIS, ROUTINE W REFLEX MICROSCOPIC
Bilirubin Urine: NEGATIVE
Glucose, UA: NEGATIVE mg/dL
Hgb urine dipstick: NEGATIVE
Ketones, ur: NEGATIVE mg/dL
Leukocytes,Ua: NEGATIVE
Nitrite: NEGATIVE
Protein, ur: NEGATIVE mg/dL
Specific Gravity, Urine: 1.018 (ref 1.005–1.030)
pH: 6 (ref 5.0–8.0)

## 2023-09-25 LAB — LIPASE, BLOOD: Lipase: 41 U/L (ref 11–51)

## 2023-09-25 MED ORDER — LIDOCAINE 5 % EX PTCH
1.0000 | MEDICATED_PATCH | CUTANEOUS | 0 refills | Status: DC
  Filled 2023-09-25: qty 30, 30d supply, fill #0

## 2023-09-25 MED ORDER — KETOROLAC TROMETHAMINE 30 MG/ML IJ SOLN
30.0000 mg | Freq: Once | INTRAMUSCULAR | Status: AC
  Administered 2023-09-25: 30 mg via INTRAMUSCULAR
  Filled 2023-09-25: qty 1

## 2023-09-25 MED ORDER — HYDROCODONE-ACETAMINOPHEN 5-325 MG PO TABS
1.0000 | ORAL_TABLET | Freq: Once | ORAL | Status: AC
  Administered 2023-09-25: 1 via ORAL
  Filled 2023-09-25: qty 1

## 2023-09-25 MED ORDER — METHOCARBAMOL 500 MG PO TABS
500.0000 mg | ORAL_TABLET | Freq: Two times a day (BID) | ORAL | 0 refills | Status: DC
  Filled 2023-09-25: qty 20, 10d supply, fill #0

## 2023-09-25 MED ORDER — NAPROXEN 500 MG PO TABS
500.0000 mg | ORAL_TABLET | Freq: Two times a day (BID) | ORAL | 0 refills | Status: DC
  Filled 2023-09-25: qty 30, 15d supply, fill #0

## 2023-09-25 NOTE — ED Notes (Signed)
 AVS provided by EDP was reviewed with pt. Pain management along with prescribed medications reviewed with pt. Pt verified pharmacy. Pt going home with husband and daughter at bedside. Ambulatory without distress.

## 2023-09-25 NOTE — ED Provider Notes (Signed)
 [place] EMERGENCY DEPARTMENT AT [facility] Provider Note   CSN: [id number] Arrival date & time: 09/25/23  1543    History  Chief Complaint  Patient presents with   Back Pain    Lisa [name] is a 53 y.o. female here for evaluation of right flank pain.  Radiates into to her right lower back.  Pain x 1 week.  Has been taking Tylenol without relief.  No recent trauma or injury.  Pain does not radiate into chest, abdomen.  No fever, chest pain, shortness of breath, hemoptysis, abdominal pain, nausea, vomiting, dysuria, hematuria, rashes or lesions.  No numbness, weakness or swelling to lower extremities.  No history of similar.  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  No history of PE or DVT.  No recent surgery, immobilization or malignancy. No hx of IVDU, fever, bowel or bladder incontinence, saddle paresthesia.  Spanish interpretor used  HPI     Home Medications Prior to Admission medications   Medication Sig Start Date End Date Taking? Authorizing Provider  lidocaine (LIDODERM) 5 % Place 1 patch onto the skin daily. Remove & Discard patch within 12 hours or as directed by MD 09/25/23  Yes Christphor Groft A, PA-C  methocarbamol (ROBAXIN) 500 MG tablet Take 1 tablet (500 mg total) by mouth 2 (two) times daily. 09/25/23  Yes Jerimie Mancuso A, PA-C  naproxen (NAPROSYN) 500 MG tablet Take 1 tablet (500 mg total) by mouth 2 (two) times daily. 09/25/23  Yes Stana Bayon A, PA-C  valACYclovir (VALTREX) 500 MG tablet Take 1 tablet (500 mg total) by mouth daily. 10/06/21  Yes Claiborne Rigg, NP  fluticasone (FLONASE) 50 MCG/ACT nasal spray PLACE 2 SPRAYS INTO BOTH NOSTRILS DAILY. 10/06/21 10/06/22  Claiborne Rigg, NP  losartan (COZAAR) 50 MG tablet TAKE 1 TABLET (50 MG TOTAL) BY MOUTH DAILY. Patient not taking: Reported on 10/06/2021 02/24/21 02/24/22  Claiborne Rigg, NP  meloxicam (MOBIC) 7.5 MG tablet Take 1 tablet (7.5 mg total) by mouth daily. Left hip pain  11/02/21   Claiborne Rigg, NP  pravastatin (PRAVACHOL) 10 MG tablet TAKE 1 TABLET (10 MG TOTAL) BY MOUTH DAILY. Patient not taking: Reported on 10/06/2021 02/24/21 02/24/22  Claiborne Rigg, NP      Allergies    Patient has no known allergies.    Review of Systems   Review of Systems  Constitutional: Negative.   HENT: Negative.    Respiratory: Negative.    Cardiovascular: Negative.   Gastrointestinal: Negative.   Genitourinary:  Positive for flank pain. Negative for difficulty urinating, dyspareunia, dysuria, enuresis, frequency, genital sores, hematuria, pelvic pain and urgency.  Musculoskeletal:  Positive for back pain.  Skin: Negative.   Neurological: Negative.   All other systems reviewed and are negative.   Physical Exam Updated Vital Signs BP (!) 116/90 (BP Location: Right Arm)   Pulse 63   Temp 98.9 F (37.2 C) (Oral)   Resp 15   Ht 5' (1.524 m)   Wt 79.8 kg   LMP 02/03/2019 Comment: Irregular periods.  Went 1 yr without a period then had one 08/20  SpO2 99%   BMI 34.37 kg/m  Physical Exam Vitals and nursing note reviewed.  Constitutional:      General: She is not in acute distress.    Appearance: She is well-developed. She is not ill-appearing, toxic-appearing or diaphoretic.  HENT:     Head: Normocephalic and atraumatic.     Nose:  Nose normal.     Mouth/Throat:     Mouth: Mucous membranes are moist.  Eyes:     Pupils: Pupils are equal, round, and reactive to light.  Cardiovascular:     Rate and Rhythm: Normal rate.     Pulses: Normal pulses.          Radial pulses are 2+ on the right side and 2+ on the left side.       Dorsalis pedis pulses are 2+ on the right side and 2+ on the left side.     Heart sounds: Normal heart sounds.  Pulmonary:     Effort: No respiratory distress.  Abdominal:     General: Bowel sounds are normal. There is no distension.     Palpations: Abdomen is soft.     Tenderness: There is abdominal tenderness. There is right CVA  tenderness. There is no left CVA tenderness, guarding or rebound. Negative signs include Murphy's sign.     Comments: Abdomen soft, no anterior abdominal tenderness however does have some tenderness to her left mid and lower flank.  Musculoskeletal:        General: Normal range of motion.     Cervical back: Normal range of motion.       Back:     Comments: No midline tenderness.  Negative straight leg raise.  Compartments soft.  Full range of motion without difficulty.  Calf soft, nontender.  Homan negative bilaterally  Skin:    General: Skin is warm and dry.     Capillary Refill: Capillary refill takes less than 2 seconds.     Comments: No rashes or lesions over areas of pain.  Neurological:     General: No focal deficit present.     Mental Status: She is alert.     Cranial Nerves: Cranial nerves 2-12 are intact.     Sensory: Sensation is intact.     Motor: Motor function is intact.     Gait: Gait is intact.     Comments: Equal strength Intact sensation Ambulatory that difficulty  Psychiatric:        Mood and Affect: Mood normal.    ED Results / Procedures / Treatments   Labs (all labs ordered are listed, but only abnormal results are displayed) Labs Reviewed  COMPREHENSIVE METABOLIC PANEL - Abnormal; Notable for the following components:      Result Value   Glucose, Bld 123 (*)    All other components within normal limits  URINALYSIS, ROUTINE W REFLEX MICROSCOPIC  CBC WITH DIFFERENTIAL/PLATELET  LIPASE, BLOOD    EKG None  Radiology CT L-SPINE NO CHARGE Result Date: 09/25/2023 CLINICAL DATA:  Back pain EXAM: CT LUMBAR SPINE WITHOUT CONTRAST TECHNIQUE: Multidetector CT imaging of the lumbar spine was performed without intravenous contrast administration. Multiplanar CT image reconstructions were also generated. RADIATION DOSE REDUCTION: This exam was performed according to the departmental dose-optimization program which includes automated exposure control, adjustment of  the mA and/or kV according to patient size and/or use of iterative reconstruction technique. COMPARISON:  None Available. FINDINGS: Segmentation: 5 lumbar type vertebrae. Alignment: Normal. Vertebrae: No acute fracture or focal pathologic process. Paraspinal and other soft tissues: Negative. Disc levels: There is broad-based disc bulge at L4-L5 with facet arthropathy and mild thickening of the ligamentum flavum. There is no central canal stenosis. There is mild right neural foraminal stenosis. There is bilateral facet arthropathy at L5-S1 with mild right neural foraminal stenosis. No central canal stenosis IMPRESSION: 1. No acute fracture  or traumatic subluxation of the lumbar spine. 2. Mild degenerative changes at L4-L5 and L5-S1. Electronically Signed   By: Darliss Cheney M.D.   On: 09/25/2023 21:55   CT Renal Stone Study Result Date: 09/25/2023 CLINICAL DATA:  Right flank pain EXAM: CT ABDOMEN AND PELVIS WITHOUT CONTRAST TECHNIQUE: Multidetector CT imaging of the abdomen and pelvis was performed following the standard protocol without IV contrast. RADIATION DOSE REDUCTION: This exam was performed according to the departmental dose-optimization program which includes automated exposure control, adjustment of the mA and/or kV according to patient size and/or use of iterative reconstruction technique. COMPARISON:  None Available. FINDINGS: Lower chest: No acute abnormality. Hepatobiliary: No focal liver abnormality is seen. No gallstones, gallbladder wall thickening, or biliary dilatation. Pancreas: Unremarkable. No pancreatic ductal dilatation or surrounding inflammatory changes. Spleen: Normal in size without focal abnormality. Adrenals/Urinary Tract: Adrenal glands are within normal limits. Kidneys demonstrate no renal calculi or obstructive changes. Ureters are within normal limits. The bladder is partially distended. Stomach/Bowel: The appendix is within normal limits. No obstructive or inflammatory changes  of the colon are seen. The stomach and small bowel are within normal limits. Vascular/Lymphatic: No significant vascular findings are present. No enlarged abdominal or pelvic lymph nodes. Reproductive: Uterus and bilateral adnexa are unremarkable. Other: No abdominal wall hernia or abnormality. No abdominopelvic ascites. Musculoskeletal: No acute abnormality noted. IMPRESSION: No acute abnormality noted to correspond with the given clinical history. Electronically Signed   By: Alcide Clever M.D.   On: 09/25/2023 21:22    Procedures Procedures    Medications Ordered in ED Medications  HYDROcodone-acetaminophen (NORCO/VICODIN) 5-325 MG per tablet 1 tablet (1 tablet Oral Given 09/25/23 1839)  ketorolac (TORADOL) 30 MG/ML injection 30 mg (30 mg Intramuscular Given 09/25/23 1828)   ED Course/ Medical Decision Making/ A&P    53 year old here for evaluation of right-sided flank pain.  Goes into her lower back.  No recent falls or injuries.  Symptoms reproducible on exam.  No overlying skin changes to suggest zoster.  No UTI symptoms.  Ambulatory here.  Abdomen soft, nontender.  She is Wells criteria low risk.  No chest pain, shortness of breath, risk factors for VTE.  Plan on labs, GYN, reassess  Labs and imaging personally viewed and interpreted:  CBC without leukocytosis Metabolic panel without significant abnormality UA neg for infection Lipase 41 CT stone without acute abnormality CT lumbar wo acute abnormality   Patient reassessed.  Pain controlled.  Likely musculoskeletal in nature.  Will treat symptomatic management.  At this time suspicion for acute emergent pathology.  Patient is nontoxic, nonseptic appearing, in no apparent distress.  Patient's pain and other symptoms adequately managed in emergency department.  Fluid bolus given.  Labs, imaging and vitals reviewed.  Patient does not meet the SIRS or Sepsis criteria.  On repeat exam patient does not have a surgical abdomin and there  are no peritoneal signs.  No indication of appendicitis, bowel obstruction, bowel perforation, cholecystitis, diverticulitis, PID, intermittent/persistent torsion, ectopic pregnancy, PE, dissection, pneumonia, pneumothorax.  Patient discharged home with symptomatic treatment and given strict instructions for follow-up with their primary care physician.  I have also discussed reasons to return immediately to the ER.  Patient expresses understanding and agrees with plan.    The patient has been appropriately medically screened and/or stabilized in the ED. I have low suspicion for any other emergent medical condition which would require further screening, evaluation or treatment in the ED or require inpatient management.  Patient is  hemodynamically stable and in no acute distress.  Patient able to ambulate in department prior to ED.  Evaluation does not show acute pathology that would require ongoing or additional emergent interventions while in the emergency department or further inpatient treatment.  I have discussed the diagnosis with the patient and answered all questions.  Pain is been managed while in the emergency department and patient has no further complaints prior to discharge.  Patient is comfortable with plan discussed in room and is stable for discharge at this time.  I have discussed strict return precautions for returning to the emergency department.  Patient was encouraged to follow-up with PCP/specialist refer to at discharge.                                 Medical Decision Making Amount and/or Complexity of Data Reviewed Independent Historian: friend External Data Reviewed: labs, radiology and notes. Labs: ordered. Decision-making details documented in ED Course. Radiology: ordered and independent interpretation performed. Decision-making details documented in ED Course.  Risk OTC drugs. Prescription drug management. Parenteral controlled substances. Decision regarding  hospitalization. Diagnosis or treatment significantly limited by social determinants of health.         Final Clinical Impression(s) / ED Diagnoses Final diagnoses:  Acute right-sided low back pain without sciatica    Rx / DC Orders ED Discharge Orders          Ordered    methocarbamol (ROBAXIN) 500 MG tablet  2 times daily        09/25/23 2225    lidocaine (LIDODERM) 5 %  Every 24 hours        09/25/23 2225    naproxen (NAPROSYN) 500 MG tablet  2 times daily        09/25/23 2225              Ballard Budney A, PA-C 09/25/23 2340    Melene Plan, DO 09/26/23 1455

## 2023-09-25 NOTE — ED Notes (Signed)
 Patient transported to CT

## 2023-09-25 NOTE — Discharge Instructions (Signed)
 It was a pleasure taking care of you here today.  Take the medications as prescribed  Follow-up outpatient, return for any worsening symptoms

## 2023-09-25 NOTE — ED Triage Notes (Signed)
 Pt complaining of pain her right back x1 week. States the pain shoots down into her right buttocks.Denies urinary symptoms. Pt states that when she coughs it causing the pain to be worse.  She has been taking tylenol without relief.

## 2023-09-26 ENCOUNTER — Other Ambulatory Visit: Payer: Self-pay

## 2024-05-03 ENCOUNTER — Ambulatory Visit: Payer: Self-pay

## 2024-05-03 NOTE — Telephone Encounter (Signed)
 FYI Only or Action Required?: FYI only for provider: UC advised.  Patient was last seen in primary care on 11/02/2021 by Lisa [name] ORN, NP.  Called Nurse Triage reporting Fatigue.  Symptoms began several weeks ago.  Interventions attempted: OTC medications: tylenol , ibuprofen  and Rest, hydration, or home remedies.  Symptoms are: gradually worsening.  Triage Disposition: See HCP Within 4 Hours (Or PCP Triage)  Patient/caregiver understands and will follow disposition?:   Copied from CRM #[id number]. Topic: Clinical - Red Word Triage >> May 03, 2024  3:39 PM Kevelyn M wrote: Patient has been feeling very week and tired for while but has gotten worse over the last few weeks. Reason for Disposition  [1] MODERATE weakness (e.g., interferes with work, school, normal activities) AND [2] persists > 3 days  Answer Assessment - Initial Assessment Questions Patient has been having fatigue, generalized weakness, and body aches. Denies fever, SOB, CP, abdominal pain. Endorses hydration and regular meals. Has not been to PCP in 2-3 years.  She comes home from work feeling like every bone hurts. Her legs hurt from her hips down. She was told years ago she might have arthritis. Feels like her feel are swollen but they are not. Denies redness, fever, or hot hard knot. Taking tylenol  and ibuprofen  with minimal relief. The pain is so much sometimes she just wants to sit and not get back up. Having occasional dizzy spells. Described as spinning sensation lasting only a few seconds. Random with no correlating symptoms. She does not check her BP at home, is hydrated. Denies LOC. Has not had period in 4-5 years but she has recently been spotting brown blood over the last few weeks. Unsure cause.  Only taking Valtrex  prescription.  No PCP appts within Dispo. Advised to see UC in the next 24hrs to be assessed. Advised she might need blood work to check for low iron, or B12 deficiency. She agrees that she needs  assessing and will be able to go to [facility] tomorrow sometime. Offered to make appt but she is unsure when she would be able to make it. ED/UC precautions given and understood.  She will reach back out after Eval to schedule   1. DESCRIPTION: Describe how you are feeling.     Very tired, feels weak, like she might pass out at work. Generalized body pains- feels like her bones hurt.  2. SEVERITY: How bad is it?  Can you stand and walk?     8/10- working making it worse.  3. ONSET: When did these symptoms begin? (e.g., hours, days, weeks, months)     A few weeks 4. CAUSE: What do you think is causing the weakness or fatigue? (e.g., not drinking enough fluids, medical problem, trouble sleeping)     unsure 5. NEW MEDICINES:  Have you started on any new medicines recently? (e.g., opioid pain medicines, benzodiazepines, muscle relaxants, antidepressants, antihistamines, neuroleptics, beta blockers)     Denies new meds 6. OTHER SYMPTOMS: Do you have any other symptoms? (e.g., chest pain, fever, cough, SOB, vomiting, diarrhea, bleeding, other areas of pain)     Denies  Protocols used: Weakness (Generalized) and Fatigue-A-AH

## 2024-05-06 NOTE — Telephone Encounter (Signed)
 Noted

## 2024-05-08 ENCOUNTER — Encounter (HOSPITAL_COMMUNITY): Payer: Self-pay

## 2024-05-08 ENCOUNTER — Ambulatory Visit (HOSPITAL_COMMUNITY)
Admission: EM | Admit: 2024-05-08 | Discharge: 2024-05-08 | Disposition: A | Discharge status: Home / Self Care | Attending: Physician Assistant | Admitting: Physician Assistant

## 2024-05-08 DIAGNOSIS — R5383 Other fatigue: Secondary | ICD-10-CM | POA: Insufficient documentation

## 2024-05-08 DIAGNOSIS — M722 Plantar fascial fibromatosis: Secondary | ICD-10-CM | POA: Insufficient documentation

## 2024-05-08 DIAGNOSIS — M791 Myalgia, unspecified site: Secondary | ICD-10-CM | POA: Insufficient documentation

## 2024-05-08 LAB — COMPREHENSIVE METABOLIC PANEL WITH GFR
ALT: 32 U/L (ref 0–44)
AST: 27 U/L (ref 15–41)
Albumin: 3.9 g/dL (ref 3.5–5.0)
Alkaline Phosphatase: 85 U/L (ref 38–126)
Anion gap: 10 (ref 5–15)
BUN: 12 mg/dL (ref 6–20)
CO2: 25 mmol/L (ref 22–32)
Calcium: 8.9 mg/dL (ref 8.9–10.3)
Chloride: 105 mmol/L (ref 98–111)
Creatinine, Ser: 0.5 mg/dL (ref 0.44–1.00)
GFR, Estimated: >60 mL/min (ref >60)
Glucose, Bld: 136 mg/dL — ABNORMAL HIGH (ref 70–99)
Potassium: 3.4 mmol/L — ABNORMAL LOW (ref 3.5–5.1)
Sodium: 140 mmol/L (ref 135–145)
Total Bilirubin: 0.6 mg/dL (ref 0.0–1.2)
Total Protein: 7.2 g/dL (ref 6.5–8.1)

## 2024-05-08 LAB — CBC WITH DIFFERENTIAL/PLATELET
Abs Immature Granulocytes: 0.03 K/uL (ref 0.00–0.07)
Basophils Absolute: 0 K/uL (ref 0.0–0.1)
Basophils Relative: 1 %
Eosinophils Absolute: 0.2 K/uL (ref 0.0–0.5)
Eosinophils Relative: 3 %
HCT: 41.4 % (ref 36.0–46.0)
Hemoglobin: 14.2 g/dL (ref 12.0–15.0)
Immature Granulocytes: 1 %
Lymphocytes Relative: 46 %
Lymphs Abs: 2.9 K/uL (ref 0.7–4.0)
MCH: 30.7 pg (ref 26.0–34.0)
MCHC: 34.3 g/dL (ref 30.0–36.0)
MCV: 89.4 fL (ref 80.0–100.0)
Monocytes Absolute: 0.6 K/uL (ref 0.1–1.0)
Monocytes Relative: 10 %
Neutro Abs: 2.4 K/uL (ref 1.7–7.7)
Neutrophils Relative %: 39 %
Platelets: 266 K/uL (ref 150–400)
RBC: 4.63 MIL/uL (ref 3.87–5.11)
RDW: 12.8 % (ref 11.5–15.5)
WBC: 6.1 K/uL (ref 4.0–10.5)
nRBC: 0 % (ref 0.0–0.2)

## 2024-05-08 LAB — IRON AND TIBC
Iron: 60 ug/dL (ref 28–170)
Saturation Ratios: 17 % (ref 10.4–31.8)
TIBC: 353 ug/dL (ref 250–450)
UIBC: 293 ug/dL

## 2024-05-08 LAB — VITAMIN B12: Vitamin B-12: 313 pg/mL (ref 180–914)

## 2024-05-08 LAB — TSH: TSH: 1.116 u[IU]/mL (ref 0.350–4.500)

## 2024-05-08 NOTE — ED Triage Notes (Signed)
 Patient here today with c/o fatigue. Patient has also started having some aching in her legs and pain in both her feet. Patient states that she has been feeling this way for a while now but over the past 3 weeks has been worsening.

## 2024-05-08 NOTE — ED Provider Notes (Signed)
 [facility]    CSN: [id number] Arrival date & time: 05/08/24  1757      History   Chief Complaint Chief Complaint  Patient presents with   Fatigue    HPI Lisa [name] is a 54 y.o. female.  has a past medical history of Anxiety, Arthritis, Depression, Hypertension, and Migraine.   HPI  She is here with her daughter who is providing spanish translation per patient preference   Discussed the use of AI scribe software for clinical note transcription with the patient, who gave verbal consent to proceed.  The patient presents with fatigue and right foot pain. She is accompanied by her daughter.  She has been experiencing fatigue and significant pain from her waist down, particularly in her right foot, for approximately three weeks. The pain is localized to the bottom of the right foot. No fever or chills are present.  She experiences shortness of breath, which she attributes to anxiety, and reports chest pain when coughing. There is no consistent chest pain or palpitations. Occasionally, she feels a tapping sensation on her temples, which resolves quickly. She denies trouble speaking, facial drooping, or unilateral weakness.  Her medical history includes depression and anxiety, although she has not sought recent treatment for these conditions. She has not started any new medications.     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 No pertinent surgical history.  OB History     Gravida  4   Para      Term      Preterm      AB      Living         SAB      IAB      Ectopic      Multiple      Live Births  4            Home Medications    Prior to Admission medications   Medication Sig Start Date End Date Taking? Authorizing Provider  fluticasone  (FLONASE ) 50 MCG/ACT nasal spray PLACE 2 SPRAYS INTO BOTH NOSTRILS DAILY. Patient not taking: Reported on 05/08/2024 10/06/21 10/06/22  Fleming, Zelda W, NP  losartan  (COZAAR ) 50 MG tablet TAKE 1 TABLET (50 MG TOTAL) BY MOUTH DAILY. Patient not taking: Reported on 10/06/2021 02/24/21 02/24/22  Fleming, Zelda W, NP  pravastatin  (PRAVACHOL ) 10 MG tablet TAKE 1 TABLET (10 MG TOTAL) BY MOUTH DAILY. Patient not taking: Reported on 10/06/2021 02/24/21 02/24/22  Fleming, Zelda W, NP  valACYclovir  (VALTREX ) 500 MG tablet Take 1 tablet (500 mg total) by mouth daily. 10/06/21   Theotis Haze ORN, NP    Family History Family History  Problem Relation Age of Onset   Diabetes Mother    Hypertension Mother    Hypertension Father     Social History Social History   Tobacco Use   Smoking status: Never  Smokeless tobacco: Never  Vaping Use   Vaping status: Never Used  Substance Use Topics   Alcohol use: No   Drug use: No     Allergies   Patient has no known allergies.   Review of Systems Review of Systems  Constitutional:  Positive for fatigue. Negative for chills and fever.  Respiratory:  Positive for shortness of breath (while at work). Negative for chest tightness.   Cardiovascular:  Negative for chest pain and palpitations.  Gastrointestinal:  Negative for diarrhea, nausea and vomiting.  Musculoskeletal:  Positive for myalgias. Negative for neck pain and neck stiffness.       Right Heel pain    Neurological:  Negative for dizziness, syncope, facial asymmetry, speech difficulty, weakness, light-headedness and headaches.      Physical Exam Triage Vital Signs ED Triage Vitals  Encounter Vitals Group     BP 05/08/24 1845 (!) 159/81     Girls Systolic BP Percentile --      Girls Diastolic BP Percentile --      Boys Systolic BP Percentile --      Boys Diastolic BP Percentile --      Pulse Rate 05/08/24 1845 67     Resp 05/08/24 1845 16     Temp 05/08/24 1845 98.6 F (37 C)     Temp Source 05/08/24 1845 Oral     SpO2 05/08/24 1845 97 %     Weight --      Height --      Head Circumference --      Peak Flow --      Pain Score 05/08/24 1844 9     Pain Loc --      Pain Education --      Exclude from Growth Chart --    No data found.  Updated Vital Signs BP (!) 159/81 (BP Location: Left Arm)   Pulse 67   Temp 98.6 F (37 C) (Oral)   Resp 16   LMP 02/03/2019 Comment: Irregular periods.  Went 1 yr without a period then had one 08/20  SpO2 97%   Visual Acuity Right Eye Distance:   Left Eye Distance:   Bilateral Distance:    Right Eye Near:   Left Eye Near:    Bilateral Near:     Physical Exam Vitals reviewed.  Constitutional:      General: She is awake. She is not in acute distress.    Appearance: Normal appearance. She is well-developed and well-groomed. She is not ill-appearing, toxic-appearing or diaphoretic.  HENT:     Head: Normocephalic and atraumatic.  Eyes:     General: Lids are normal. Gaze aligned appropriately.     Extraocular Movements: Extraocular movements intact.     Conjunctiva/sclera: Conjunctivae normal.  Cardiovascular:     Rate and Rhythm: Normal rate and regular rhythm.     Pulses:          Radial pulses are 2+ on the right side and 2+ on the left side.       Dorsalis pedis pulses are 2+ on the right side and 2+ on the left side.     Heart sounds: Normal heart sounds. No murmur heard.    No friction rub. No gallop.  Pulmonary:     Effort: Pulmonary effort is normal.  Musculoskeletal:     Right lower leg: No edema.     Left lower leg: No edema.     Right  ankle:     Right  Achilles Tendon: No tenderness or defects. Thompson's test negative.     Left ankle:     Left Achilles Tendon: No tenderness or defects. Thompson's test negative.     Right foot: Normal range of motion and normal capillary refill. Tenderness present. No swelling or deformity. Normal pulse.     Left foot: Normal range of motion and normal capillary refill. No swelling, deformity or tenderness. Normal pulse.     Comments: Pt has 5/5 bilaterally strength with regards to the following: Hip flexion, dorsiflexion, plantarflexion  Feet:     Right foot:     Skin integrity: Callus and dry skin present. No ulcer, blister or skin breakdown.     Toenail Condition: Right toenails are abnormally thick. Fungal disease present.    Left foot:     Skin integrity: Callus and dry skin present. No ulcer, blister or skin breakdown.     Toenail Condition: Fungal disease present.    Comments: Patient reports pain and discomfort radiating from the right heel towards the sole of the foot. Neurological:     Mental Status: She is alert and oriented to person, place, and time.  Psychiatric:        Attention and Perception: Attention and perception normal.        Mood and Affect: Mood and affect normal.        Speech: Speech normal.        Behavior: Behavior normal. Behavior is cooperative.      UC Treatments / Results  Labs (all labs ordered are listed, but only abnormal results are displayed) Labs Reviewed  CBC WITH DIFFERENTIAL/PLATELET  COMPREHENSIVE METABOLIC PANEL WITH GFR  VITAMIN B12  IRON AND TIBC  TSH    EKG   Radiology No results found.  Procedures Procedures (including critical care time)  Medications Ordered in UC Medications - No data to display  Initial Impression / Assessment and Plan / UC Course  I have reviewed the triage vital signs and the nursing notes.  Pertinent labs & imaging results that were available during my care of the patient were reviewed by me  and considered in my medical decision making (see chart for details).      Final Clinical Impressions(s) / UC Diagnoses   Final diagnoses:  Fatigue, unspecified type  Plantar fasciitis of right foot  Generalized muscle ache   Right plantar fasciitis Pain localized to the heel and extending towards the toes, consistent with plantar fasciitis. Inflammation and irritation of the fascia in the foot. - Provided educational materials on plantar fasciitis. - Recommended wearing supportive shoes with arch support. - Advised foot soaks with Epsom salt. - Recommended massage using a tennis ball or frozen water bottle.  Fatigue and right foot pain Fatigue and right foot pain for three weeks. Differential diagnosis includes anemia, electrolyte imbalance, B12 deficiency, and somatic response to anxiety or depression. Depression and anxiety may contribute to somatic symptoms. - Ordered blood work to check for anemia, electrolyte imbalance, B12, and iron levels, TSH. - Recommended alternating acetaminophen  and ibuprofen  for body aches. - Advised follow-up with primary care for further evaluation if blood work is normal for further management as needed.   Shortness of breath Intermittent shortness of breath, possibly related to anxiety. - Continue to monitor symptoms and consider further evaluation if symptoms persist or worsen.     Discharge Instructions      VISIT SUMMARY:  You came in today with fatigue and right foot pain that has been bothering  you for about three weeks. You also mentioned experiencing shortness of breath, which you believe is due to anxiety, and occasional chest pain when coughing. We discussed your symptoms and medical history, including your past experiences with depression and anxiety.  YOUR PLAN:  -RIGHT PLANTAR FASCIITIS: Plantar fasciitis is inflammation and irritation of the fascia in the foot, causing pain in the heel and extending towards the toes. We provided  you with educational materials on plantar fasciitis, recommended wearing supportive shoes with arch support, advised foot soaks with Epsom salt, and suggested massaging your foot using a tennis ball or frozen water bottle.  -FATIGUE AND RIGHT FOOT PAIN: Your fatigue and right foot pain could be due to several factors, including anemia, electrolyte imbalance, B12 deficiency, or a somatic response to anxiety or depression. We ordered blood work to check for anemia, electrolyte imbalance, B12, and iron levels. In the meantime, you can alternate between acetaminophen  and ibuprofen  for body aches. If your blood work comes back normal, please follow up with your primary care doctor for further evaluation.  -SHORTNESS OF BREATH: Your shortness of breath may be related to anxiety. We recommend that you continue to monitor your symptoms and seek further evaluation if they persist or worsen. Your PCP would likely be able to discuss this with you at follow up if needed.   INSTRUCTIONS:  Please complete the blood work as ordered to check for anemia, electrolyte imbalance, B12, and iron levels. Follow up with your primary care doctor for further evaluation if your blood work is normal. Continue to monitor your shortness of breath and seek further evaluation if it persists or worsens.     ED Prescriptions   None    PDMP not reviewed this encounter.   Marylene Rocky BRAVO, PA-C 05/08/24 1927

## 2024-05-08 NOTE — Discharge Instructions (Addendum)
 VISIT SUMMARY:  You came in today with fatigue and right foot pain that has been bothering you for about three weeks. You also mentioned experiencing shortness of breath, which you believe is due to anxiety, and occasional chest pain when coughing. We discussed your symptoms and medical history, including your past experiences with depression and anxiety.  YOUR PLAN:  -RIGHT PLANTAR FASCIITIS: Plantar fasciitis is inflammation and irritation of the fascia in the foot, causing pain in the heel and extending towards the toes. We provided you with educational materials on plantar fasciitis, recommended wearing supportive shoes with arch support, advised foot soaks with Epsom salt, and suggested massaging your foot using a tennis ball or frozen water bottle.  -FATIGUE AND RIGHT FOOT PAIN: Your fatigue and right foot pain could be due to several factors, including anemia, electrolyte imbalance, B12 deficiency, or a somatic response to anxiety or depression. We ordered blood work to check for anemia, electrolyte imbalance, B12, and iron levels. In the meantime, you can alternate between acetaminophen  and ibuprofen  for body aches. If your blood work comes back normal, please follow up with your primary care doctor for further evaluation.  -SHORTNESS OF BREATH: Your shortness of breath may be related to anxiety. We recommend that you continue to monitor your symptoms and seek further evaluation if they persist or worsen. Your PCP would likely be able to discuss this with you at follow up if needed.   INSTRUCTIONS:  Please complete the blood work as ordered to check for anemia, electrolyte imbalance, B12, and iron levels. Follow up with your primary care doctor for further evaluation if your blood work is normal. Continue to monitor your shortness of breath and seek further evaluation if it persists or worsens.

## 2024-05-09 ENCOUNTER — Ambulatory Visit (HOSPITAL_COMMUNITY): Payer: Self-pay | Admitting: Physician Assistant

## 2024-05-09 NOTE — Progress Notes (Signed)
 Potassium is slightly low- recommend supplementing with diet with high potassium foods to help increase and recheck with PCP in 1-2 weeks.  Other electrolytes are largely normal- slightly elevated glucose but this was not fasting labs so this may be normal if she had eaten recently.  Thyroid, B12, iron labs, and CBC are all normal. She can try supplementing with B12 to see if that improves her energy- excess is excreted in the urine so she shouldn't need to worry about overdoing it. Still recommend follow up with PCP for further evaluation and ongoing management to get to the bottom of her symptoms.

## 2024-06-18 ENCOUNTER — Encounter: Payer: Self-pay | Admitting: Nurse Practitioner

## 2024-06-18 ENCOUNTER — Other Ambulatory Visit: Payer: Self-pay

## 2024-06-18 ENCOUNTER — Ambulatory Visit: Payer: Self-pay | Attending: Nurse Practitioner | Admitting: Nurse Practitioner

## 2024-06-18 VITALS — BP 140/80 | HR 63 | Ht 60.0 in | Wt 180.6 lb

## 2024-06-18 DIAGNOSIS — I10 Essential (primary) hypertension: Secondary | ICD-10-CM

## 2024-06-18 DIAGNOSIS — Z1211 Encounter for screening for malignant neoplasm of colon: Secondary | ICD-10-CM

## 2024-06-18 DIAGNOSIS — Z1231 Encounter for screening mammogram for malignant neoplasm of breast: Secondary | ICD-10-CM

## 2024-06-18 DIAGNOSIS — M722 Plantar fascial fibromatosis: Secondary | ICD-10-CM

## 2024-06-18 DIAGNOSIS — E7800 Pure hypercholesterolemia, unspecified: Secondary | ICD-10-CM

## 2024-06-18 DIAGNOSIS — R5383 Other fatigue: Secondary | ICD-10-CM

## 2024-06-18 DIAGNOSIS — Z23 Encounter for immunization: Secondary | ICD-10-CM

## 2024-06-18 DIAGNOSIS — E785 Hyperlipidemia, unspecified: Secondary | ICD-10-CM

## 2024-06-18 MED ORDER — MELOXICAM 7.5 MG PO TABS
7.5000 mg | ORAL_TABLET | Freq: Every day | ORAL | 0 refills | Status: DC
  Filled 2024-06-18: qty 30, 30d supply, fill #0

## 2024-06-18 MED ORDER — LOSARTAN POTASSIUM 50 MG PO TABS
ORAL_TABLET | Freq: Every day | ORAL | 1 refills | Status: AC
  Filled 2024-06-18: qty 90, 90d supply, fill #0

## 2024-06-18 MED ORDER — PRAVASTATIN SODIUM 10 MG PO TABS
ORAL_TABLET | Freq: Every day | ORAL | 1 refills | Status: AC
  Filled 2024-06-18: qty 90, 90d supply, fill #0

## 2024-06-18 NOTE — Progress Notes (Signed)
 Assessment & Plan:  Lisa [name] was seen today for fatigue.  Diagnoses and all orders for this visit:  Fatigue, unspecified type -     Hemoglobin A1c -     Estrogens , Total -     Urinalysis, Complete Persistent fatigue with negative labs. Possible factors: uncontrolled hypertension, inadequate sleep, menopause, dehydration. Differential includes prediabetes and menopause. - Ordered A1c to rule out prediabetes. - Ordered estrogen levels to assess for menopause. - Recommended B12 sublingual for energy. - Advised on sleep hygiene and hydration.   Need for influenza vaccination -     Flu vaccine trivalent PF, 6mos and older(Flulaval,Afluria,Fluarix,Fluzone)  Breast cancer screening by mammogram -     MS 3D SCR MAMMO BILAT BR (aka MM); Future  Colon cancer screening -     Fecal occult blood, imunochemical  Primary hypertension -     losartan  (COZAAR ) 50 MG tablet; TAKE 1 TABLET (50 MG TOTAL) BY MOUTH DAILY. Hypertension may contribute to fatigue if uncontrolled. - Continue to monitor blood pressure and  restart blood pressure medication.   Dyslipidemia, goal LDL below 100 -     Lipid panel -     pravastatin  (PRAVACHOL ) 10 MG tablet; TAKE 1 TABLET (10 MG TOTAL) BY MOUTH DAILY. Lab Results  Component Value Date   LDLCALC 133 (H) 10/06/2021   LDL not at goal   Plantar fasciitis of right foot -     meloxicam  (MOBIC ) 7.5 MG tablet; Take 1 tablet (7.5 mg total) by mouth daily. Chronic heel pain, likely plantar fasciitis. - Provided exercises for foot. - Prescribed anti-inflammatory medication. - Will refer to foot specialist if symptoms persist   Patient has been counseled on age-appropriate routine health concerns for screening and prevention. These are reviewed and up-to-date. Referrals have been placed accordingly. Immunizations are up-to-date or declined.    Subjective:   Chief Complaint  Patient presents with   Fatigue    Lisa [name] 53 y.o. female presents to  office today for follow up to HTN, right heel pain and fatigue   VRI was used to communicate directly with patient for the entire encounter including providing detailed patient instructions.     HTN Blood pressure is elevated today. She is not taking Losartan  as she has not been seen in the office in quite some time for refills and office visit  BP Readings from Last 3 Encounters:  06/18/24 (!) 140/80  05/08/24 (!) 159/81  09/25/23 (!) 116/90       She has been experiencing constant pain in her right heel for several months, which worsens with activity such as walking or standing. The pain is particularly severe when she gets up after resting or during the night when she rises to use the bathroom. She has tried different shoes but continues to experience heel pain regardless of footwear.  She reports persistent fatigue and has undergone extensive lab work, most of which returned negative results after an ED visit last month.  She experiences numbness in her fingers, particularly in the morning, with the right ring finger occasionally getting 'stuck' for a short period of time. She suspects this may be related to arthritis, which she has been previously diagnosed with.  She has noticed small bruises appearing on her abdomen, although previous tests showed no bleeding disorders with normal CBC.  Review of Systems  Constitutional:  Positive for malaise/fatigue. Negative for fever and weight loss.  HENT: Negative.  Negative for nosebleeds.   Eyes: Negative.  Negative  for blurred vision, double vision and photophobia.  Respiratory: Negative.  Negative for cough and shortness of breath.   Cardiovascular: Negative.  Negative for chest pain, palpitations and leg swelling.  Gastrointestinal: Negative.  Negative for heartburn, nausea and vomiting.  Musculoskeletal:  Positive for joint pain. Negative for myalgias.  Neurological: Negative.  Negative for dizziness, focal weakness, seizures and  headaches.  Psychiatric/Behavioral:  Positive for depression. Negative for suicidal ideas.     Past Medical History:  Diagnosis Date   Anxiety    Arthritis    Depression    Hypertension    Migraine     History reviewed. No pertinent surgical history.  Family History  Problem Relation Age of Onset   Diabetes Mother    Hypertension Mother    Hypertension Father     Social History Reviewed with no changes to be made today.   Outpatient Medications Prior to Visit  Medication Sig Dispense Refill   valACYclovir  (VALTREX ) 500 MG tablet Take 1 tablet (500 mg total) by mouth daily. 90 tablet 1   fluticasone  (FLONASE ) 50 MCG/ACT nasal spray PLACE 2 SPRAYS INTO BOTH NOSTRILS DAILY. (Patient not taking: Reported on 06/18/2024) 16 g 6   losartan  (COZAAR ) 50 MG tablet TAKE 1 TABLET (50 MG TOTAL) BY MOUTH DAILY. (Patient not taking: Reported on 06/18/2024) 90 tablet 1   pravastatin  (PRAVACHOL ) 10 MG tablet TAKE 1 TABLET (10 MG TOTAL) BY MOUTH DAILY. (Patient not taking: Reported on 06/18/2024) 90 tablet 1   No facility-administered medications prior to visit.    Allergies[1]     Objective:    BP (!) 140/80 (BP Location: Left Arm, Patient Position: Sitting, Cuff Size: Normal)   Pulse 63   Ht 5' (1.524 m)   Wt 180 lb 9.6 oz (81.9 kg)   LMP 02/03/2019 Comment: Irregular periods.  Went 1 yr without a period then had one 08/20  SpO2 100%   BMI 35.27 kg/m  Wt Readings from Last 3 Encounters:  06/18/24 180 lb 9.6 oz (81.9 kg)  09/25/23 176 lb (79.8 kg)  10/06/21 178 lb (80.7 kg)    Physical Exam Vitals and nursing note reviewed.  Constitutional:      Appearance: She is well-developed.  HENT:     Head: Normocephalic and atraumatic.  Cardiovascular:     Rate and Rhythm: Normal rate and regular rhythm.     Heart sounds: Normal heart sounds. No murmur heard.    No friction rub. No gallop.  Pulmonary:     Effort: Pulmonary effort is normal. No tachypnea or respiratory distress.      Breath sounds: Normal breath sounds. No decreased breath sounds, wheezing, rhonchi or rales.  Chest:     Chest wall: No tenderness.  Abdominal:     General: Bowel sounds are normal.     Palpations: Abdomen is soft.  Musculoskeletal:        General: No swelling or deformity. Normal range of motion.     Cervical back: Normal range of motion.  Skin:    General: Skin is warm and dry.  Neurological:     Mental Status: She is alert and oriented to person, place, and time.     Coordination: Coordination normal.  Psychiatric:        Behavior: Behavior normal. Behavior is cooperative.        Thought Content: Thought content normal.        Judgment: Judgment normal.          Patient has  been counseled extensively about nutrition and exercise as well as the importance of adherence with medications and regular follow-up. The patient was given clear instructions to go to ER or return to medical center if symptoms don't improve, worsen or new problems develop. The patient verbalized understanding.   Follow-up: Return for 4-6 weeks heel pain.   Lisa LELON Servant, FNP-BC [facility] [place], [place] [phone]   06/18/2024, 4:08 PM     [1] No Known Allergies

## 2024-06-19 LAB — URINALYSIS, COMPLETE
Bilirubin, UA: NEGATIVE
Glucose, UA: NEGATIVE
Ketones, UA: NEGATIVE
Leukocytes,UA: NEGATIVE
Nitrite, UA: NEGATIVE
Protein,UA: NEGATIVE
RBC, UA: NEGATIVE
Specific Gravity, UA: 1.015 (ref 1.005–1.030)
Urobilinogen, Ur: 0.2 mg/dL (ref 0.2–1.0)
pH, UA: 7.5 (ref 5.0–7.5)

## 2024-06-19 LAB — MICROSCOPIC EXAMINATION
Bacteria, UA: NONE SEEN
Casts: NONE SEEN /LPF
WBC, UA: NONE SEEN /HPF (ref 0–5)

## 2024-06-19 LAB — HEMOGLOBIN A1C
Est. average glucose Bld gHb Est-mCnc: 126 mg/dL
Hgb A1c MFr Bld: 6 % — ABNORMAL HIGH (ref 4.8–5.6)

## 2024-06-19 LAB — ESTROGENS, TOTAL: Estrogen: 37 pg/mL

## 2024-06-19 LAB — LIPID PANEL
Chol/HDL Ratio: 4.2 ratio (ref 0.0–4.4)
Cholesterol, Total: 210 mg/dL — ABNORMAL HIGH (ref 100–199)
HDL: 50 mg/dL (ref >39)
LDL Chol Calc (NIH): 122 mg/dL — ABNORMAL HIGH (ref 0–99)
Triglycerides: 218 mg/dL — ABNORMAL HIGH (ref 0–149)
VLDL Cholesterol Cal: 38 mg/dL (ref 5–40)

## 2024-06-21 ENCOUNTER — Other Ambulatory Visit: Payer: Self-pay

## 2024-06-22 ENCOUNTER — Ambulatory Visit: Payer: Self-pay | Admitting: Nurse Practitioner

## 2024-06-22 LAB — FECAL OCCULT BLOOD, IMMUNOCHEMICAL: Fecal Occult Bld: NEGATIVE

## 2024-07-30 ENCOUNTER — Ambulatory Visit: Payer: Self-pay | Attending: Nurse Practitioner | Admitting: Nurse Practitioner

## 2024-07-30 ENCOUNTER — Encounter: Payer: Self-pay | Admitting: Nurse Practitioner

## 2024-07-30 ENCOUNTER — Other Ambulatory Visit: Payer: Self-pay

## 2024-07-30 DIAGNOSIS — M722 Plantar fascial fibromatosis: Secondary | ICD-10-CM

## 2024-07-30 MED ORDER — MELOXICAM 15 MG PO TABS
15.0000 mg | ORAL_TABLET | Freq: Every day | ORAL | 0 refills | Status: AC
  Filled 2024-07-30: qty 30, 30d supply, fill #0

## 2024-07-30 NOTE — Progress Notes (Signed)
 "  Assessment & Plan:  Lisa [name] was seen today for foot pain.  Diagnoses and all orders for this visit:  Plantar fasciitis of right foot -     meloxicam  (MOBIC ) 15 MG tablet; Take 1 tablet (15 mg total) by mouth daily. For heel pain She declines referral to podiatry due to lack of insurance Patient was urged to apply for the financial assistance program.  They were instructed to inquire at the front desk about the application process for the [employer] discount, orange card or other financial assistance.       Patient has been counseled on age-appropriate routine health concerns for screening and prevention. These are reviewed and up-to-date. Referrals have been placed accordingly. Immunizations are up-to-date or declined.    Subjective:   Chief Complaint  Patient presents with   Foot Pain    Heel pain follow up. Patient state that the pain has not gotten any better.    Lisa [name] 54 y.o. female presents to office today for heel pain follow up.   When I saw her on 06-18-2024 she reported experiencing constant pain in her right heel for several months, which worsened with activity such as walking or standing. The pain is particularly severe when she gets up after resting or during the night when she rises to use the bathroom. She has tried different shoes but continues to experience heel pain regardless of footwear    Today she states her foot pain has not improved despite taking meloxicam  and active foot exercise which was prescribed for her several weeks ago during her visit in December   Review of Systems  Constitutional:  Positive for malaise/fatigue. Negative for fever and weight loss.  HENT: Negative.  Negative for nosebleeds.   Respiratory: Negative.  Negative for cough and shortness of breath.   Cardiovascular: Negative.  Negative for chest pain, palpitations and leg swelling.  Musculoskeletal:  Positive for joint pain. Negative for myalgias.  Neurological: Negative.   Negative for dizziness, focal weakness, seizures and headaches.  Psychiatric/Behavioral: Negative.  Negative for suicidal ideas.     Past Medical History:  Diagnosis Date   Anxiety    Arthritis    Depression    Hypertension    Migraine     History reviewed. No pertinent surgical history.  Family History  Problem Relation Age of Onset   Diabetes Mother    Hypertension Mother    Hypertension Father     Social History Reviewed with no changes to be made today.   Outpatient Medications Prior to Visit  Medication Sig Dispense Refill   losartan  (COZAAR ) 50 MG tablet TAKE 1 TABLET (50 MG TOTAL) BY MOUTH DAILY. 90 tablet 1   pravastatin  (PRAVACHOL ) 10 MG tablet TAKE 1 TABLET (10 MG TOTAL) BY MOUTH DAILY. 90 tablet 1   valACYclovir  (VALTREX ) 500 MG tablet Take 1 tablet (500 mg total) by mouth daily. 90 tablet 1   meloxicam  (MOBIC ) 7.5 MG tablet Take 1 tablet (7.5 mg total) by mouth daily. (Patient not taking: Reported on 07/30/2024) 30 tablet 0   No facility-administered medications prior to visit.    Allergies[1]     Objective:    BP 121/74 (BP Location: Left Arm, Patient Position: Sitting, Cuff Size: Normal)   Pulse (!) 53   Ht 5' (1.524 m)   Wt 179 lb (81.2 kg)   LMP 02/03/2019 Comment: Irregular periods.  Went 1 yr without a period then had one 08/20  SpO2 99%  BMI 34.96 kg/m  Wt Readings from Last 3 Encounters:  07/30/24 179 lb (81.2 kg)  06/18/24 180 lb 9.6 oz (81.9 kg)  09/25/23 176 lb (79.8 kg)    Physical Exam Vitals and nursing note reviewed.  Constitutional:      Appearance: She is well-developed.  HENT:     Head: Normocephalic and atraumatic.  Cardiovascular:     Rate and Rhythm: Normal rate and regular rhythm.     Heart sounds: Normal heart sounds. No murmur heard.    No friction rub. No gallop.  Pulmonary:     Effort: Pulmonary effort is normal. No tachypnea or respiratory distress.     Breath sounds: Normal breath sounds. No decreased breath  sounds, wheezing, rhonchi or rales.  Chest:     Chest wall: No tenderness.  Musculoskeletal:        General: Normal range of motion.     Cervical back: Normal range of motion.  Skin:    General: Skin is warm and dry.  Neurological:     Mental Status: She is alert and oriented to person, place, and time.     Coordination: Coordination normal.  Psychiatric:        Behavior: Behavior normal. Behavior is cooperative.        Thought Content: Thought content normal.        Judgment: Judgment normal.          Patient has been counseled extensively about nutrition and exercise as well as the importance of adherence with medications and regular follow-up. The patient was given clear instructions to go to ER or return to medical center if symptoms don't improve, worsen or new problems develop. The patient verbalized understanding.   Follow-up: Return in about 3 months (around 10/28/2024) for a1c/htn. , NEEDS ORANGE CARD/CAFA APP.   Lisa LELON Servant, FNP-BC [facility] [place], [place] [phone]   07/30/2024, 1:47 PM    [1] No Known Allergies  "

## 2024-10-28 ENCOUNTER — Ambulatory Visit: Payer: Self-pay | Admitting: Nurse Practitioner
